# Patient Record
Sex: Female | Born: 1995 | Race: White | Hispanic: No | Marital: Single | Smoking: Never smoker
Health system: Southern US, Community
[De-identification: ages and names within clinical notes are randomized; demographics above are authoritative.]

## PROBLEM LIST (undated history)

## (undated) DIAGNOSIS — F32A Depression, unspecified: Secondary | ICD-10-CM

## (undated) DIAGNOSIS — J45909 Unspecified asthma, uncomplicated: Secondary | ICD-10-CM

## (undated) DIAGNOSIS — F329 Major depressive disorder, single episode, unspecified: Secondary | ICD-10-CM

## (undated) DIAGNOSIS — F419 Anxiety disorder, unspecified: Secondary | ICD-10-CM

## (undated) DIAGNOSIS — I82409 Acute embolism and thrombosis of unspecified deep veins of unspecified lower extremity: Secondary | ICD-10-CM

## (undated) HISTORY — DX: Depression, unspecified: F32.A

## (undated) HISTORY — DX: Anxiety disorder, unspecified: F41.9

## (undated) HISTORY — DX: Major depressive disorder, single episode, unspecified: F32.9

## (undated) HISTORY — PX: TONSILLECTOMY: SUR1361

## (undated) HISTORY — DX: Unspecified asthma, uncomplicated: J45.909

---
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
## 2016-05-06 ENCOUNTER — Ambulatory Visit: Payer: Self-pay

## 2016-05-09 ENCOUNTER — Ambulatory Visit: Payer: Self-pay | Admitting: Obstetrics and Gynecology

## 2016-05-10 ENCOUNTER — Encounter: Payer: Self-pay | Admitting: Obstetrics and Gynecology

## 2016-05-10 ENCOUNTER — Ambulatory Visit: Payer: Self-pay | Admitting: Obstetrics and Gynecology

## 2016-05-10 DIAGNOSIS — Z308 Encounter for other contraceptive management: Secondary | ICD-10-CM | POA: Insufficient documentation

## 2016-05-11 ENCOUNTER — Encounter: Payer: Self-pay | Admitting: Obstetrics and Gynecology

## 2016-05-11 ENCOUNTER — Ambulatory Visit (INDEPENDENT_AMBULATORY_CARE_PROVIDER_SITE_OTHER): Payer: Medicaid Other

## 2016-05-11 ENCOUNTER — Ambulatory Visit (INDEPENDENT_AMBULATORY_CARE_PROVIDER_SITE_OTHER): Payer: Medicaid Other | Admitting: Obstetrics and Gynecology

## 2016-05-11 VITALS — BP 110/72 | Ht 60.0 in | Wt 112.0 lb

## 2016-05-11 DIAGNOSIS — Z113 Encounter for screening for infections with a predominantly sexual mode of transmission: Secondary | ICD-10-CM | POA: Diagnosis not present

## 2016-05-11 DIAGNOSIS — Z3042 Encounter for surveillance of injectable contraceptive: Secondary | ICD-10-CM

## 2016-05-11 DIAGNOSIS — Z01419 Encounter for gynecological examination (general) (routine) without abnormal findings: Secondary | ICD-10-CM

## 2016-05-11 DIAGNOSIS — Z0000 Encounter for general adult medical examination without abnormal findings: Secondary | ICD-10-CM | POA: Diagnosis not present

## 2016-05-11 MED ORDER — MEDROXYPROGESTERONE ACETATE 150 MG/ML IM SUSY: 150.0000 mg | PREFILLED_SYRINGE | Freq: Once | INTRAMUSCULAR | 3 refills | Status: DC

## 2016-05-11 MED ORDER — MEDROXYPROGESTERONE ACETATE 150 MG/ML IM SUSP
150.0000 mg | Freq: Once | INTRAMUSCULAR | Status: AC
  Administered 2016-05-11: 150 mg via INTRAMUSCULAR

## 2016-05-11 NOTE — Progress Notes (Signed)
Chief Complaint  Patient presents with  . abnormal bleeding  . Contraception   Annual  HPI:      [name] is a 21 y.o. G0P0000 who LMP was Patient's last menstrual period was 03/13/2016., presents today for her annual examination.  Her menses are irregular with the depo. She has had 2 shots and is due for her third. She has had irreg bleeding for the past month. Dysmenorrhea none. She would like to cont the depo. She is using it for period relief.   Sex activity: has sex with females.   Hx of STDs: none  There is no FH of breast cancer. There is no FH of ovarian cancer. The patient does do self-breast exams.  Tobacco use: The patient denies current or previous tobacco use. Alcohol use: social drinker Exercise: moderately active  She does not get adequate calcium and Vitamin D in her diet.   Past Medical History:  Diagnosis Date  . Asthma     Past Surgical History:  Procedure Laterality Date  . TONSILLECTOMY      Family History  Problem Relation Age of Onset  . Cancer Mother 4    colon    Social History   Social History  . Marital status: Single    Spouse name: N/A  . Number of children: N/A  . Years of education: 48   Occupational History  . restaurant employee    Social History Main Topics  . Smoking status: Never Smoker  . Smokeless tobacco: Never Used  . Alcohol use No  . Drug use: No  . Sexual activity: Yes    Birth control/ protection: Injection   Other Topics Concern  . Not on file   Social History Narrative  . No narrative on file     Current Outpatient Prescriptions:  Marland Kitchen  MedroxyPROGESTERone Acetate 150 MG/ML SUSY, Inject 1 mL (150 mg total) into the muscle once., Disp: 1 Syringe, Rfl: 3  ROS:  Review of Systems  Constitutional: Positive for weight loss. Negative for fever and malaise/fatigue.  HENT: Negative for congestion, ear pain and sinus pain.   Respiratory: Negative for cough, shortness of breath and wheezing.     Cardiovascular: Negative for chest pain, orthopnea and leg swelling.  Gastrointestinal: Negative for constipation, diarrhea, nausea and vomiting.  Genitourinary: Negative for dysuria, frequency, hematuria and urgency.       Breast ROS: negative   Musculoskeletal: Negative for back pain, joint pain and myalgias.  Skin: Negative for itching and rash.  Neurological: Positive for headaches. Negative for dizziness, tingling and focal weakness.  Endo/Heme/Allergies: Negative for environmental allergies. Does not bruise/bleed easily.  Psychiatric/Behavioral: Negative for depression and suicidal ideas. The patient is not nervous/anxious and does not have insomnia.     Objective: BP 110/72   Ht 5' (1.524 m)   Wt 112 lb (50.8 kg)   LMP 03/13/2016   BMI 21.87 kg/m    Physical Exam  Constitutional: She is oriented to person, place, and time. She appears well-developed and well-nourished.  Genitourinary: Vagina normal and uterus normal. No erythema or tenderness in the vagina. No vaginal discharge found. Right adnexum does not display mass and does not display tenderness. Left adnexum does not display mass and does not display tenderness. Cervix does not exhibit motion tenderness or polyp. Uterus is not enlarged or tender.  Neck: Normal range of motion. No thyromegaly present.  Cardiovascular: Normal rate, regular rhythm and normal heart sounds.   No murmur heard.  Pulmonary/Chest: Effort normal and breath sounds normal. Right breast exhibits no mass, no nipple discharge, no skin change and no tenderness. Left breast exhibits no mass, no nipple discharge, no skin change and no tenderness.  Abdominal: Soft. There is no tenderness. There is no guarding.  Musculoskeletal: Normal range of motion.  Neurological: She is alert and oriented to person, place, and time. No cranial nerve deficit.  Psychiatric: She has a normal mood and affect. Her behavior is normal.  Vitals  reviewed.   Assessment/Plan: Encounter for annual routine gynecological examination  Screening for STD (sexually transmitted disease) - Plan: Chlamydia/Gonococcus/Trichomonas, NAA  Encounter for surveillance of injectable contraceptive - Depo RF. F/u if wants to change due to irreg bleeding. Reassurance. Increase calcium.  - Plan: MedroxyPROGESTERone Acetate 150 MG/ML SUSY             GYN counsel adequate intake of calcium and vitamin D     F/U  Return in about 1 year (around 05/11/2017).  Alicia B. Copland, PA-C 05/11/2016 2:49 PM

## 2016-05-13 LAB — CHLAMYDIA/GONOCOCCUS/TRICHOMONAS, NAA
Chlamydia by NAA: NEGATIVE
Gonococcus by NAA: NEGATIVE
Trich vag by NAA: NEGATIVE

## 2016-08-03 ENCOUNTER — Ambulatory Visit (INDEPENDENT_AMBULATORY_CARE_PROVIDER_SITE_OTHER): Payer: Medicaid Other

## 2016-08-03 DIAGNOSIS — Z3042 Encounter for surveillance of injectable contraceptive: Secondary | ICD-10-CM

## 2016-08-03 MED ORDER — MEDROXYPROGESTERONE ACETATE 150 MG/ML IM SUSP
150.0000 mg | Freq: Once | INTRAMUSCULAR | Status: AC
  Administered 2016-08-03: 150 mg via INTRAMUSCULAR

## 2016-08-03 MED ORDER — MEDROXYPROGESTERONE ACETATE 150 MG/ML IM SUSY: 150.0000 mg | PREFILLED_SYRINGE | Freq: Once | INTRAMUSCULAR | 2 refills | Status: DC

## 2016-08-03 NOTE — Telephone Encounter (Signed)
This encounter was created in error - please disregard.

## 2016-08-03 NOTE — Progress Notes (Addendum)
Pt with bleeding about 10 wks into depo. Can't go full 12 wks. Will try depo Q10 wks instead. Rx changed and resent. Pt to RTO 10/12/16 (depo given today). F/u prn.

## 2016-08-03 NOTE — Progress Notes (Addendum)
[id number] Pt here today for Depo Provera Injection. Pt states this is her 4th injection. She is still beginning to bleed 2.5-3 wks prior to receiving her injection every 12 wks. Pt aware [facility] & possibly adjustment of Depo schedule to control bleeding.

## 2016-08-03 NOTE — Addendum Note (Signed)
Addended by: Althea GrimmerOPLAND, Lionell Matuszak B on: 08/03/2016 05:18 PM   Modules accepted: Orders

## 2016-08-07 ENCOUNTER — Emergency Department: Payer: Medicaid Other

## 2016-08-07 ENCOUNTER — Encounter: Payer: Self-pay | Admitting: Emergency Medicine

## 2016-08-07 DIAGNOSIS — M79604 Pain in right leg: Secondary | ICD-10-CM | POA: Diagnosis present

## 2016-08-07 DIAGNOSIS — J45909 Unspecified asthma, uncomplicated: Secondary | ICD-10-CM | POA: Diagnosis not present

## 2016-08-07 NOTE — ED Triage Notes (Signed)
Pt comes into the ED via POV c/o pain behind her right knee and calf.  Patient states the pain started today and she has a h/o DVT.  Patient states this one is worse than previous ones.  Patient states they have already had to change doses on her birth control medication for this problem.  Patient denies that it is any worse with movement, redness or heat coming from the leg.  Patient states there is swelling noted behind the right knee.

## 2016-08-08 ENCOUNTER — Emergency Department
Admission: EM | Admit: 2016-08-08 | Discharge: 2016-08-08 | Disposition: A | Discharge status: Home / Self Care | Payer: Medicaid Other | Attending: Emergency Medicine | Admitting: Emergency Medicine

## 2016-08-08 DIAGNOSIS — M79604 Pain in right leg: Secondary | ICD-10-CM

## 2016-08-08 HISTORY — DX: Acute embolism and thrombosis of unspecified deep veins of unspecified lower extremity: I82.409

## 2016-08-08 MED ORDER — IBUPROFEN 800 MG PO TABS: 800.0000 mg | ORAL_TABLET | Freq: Three times a day (TID) | ORAL | 0 refills | Status: DC | PRN

## 2016-08-08 MED ORDER — IBUPROFEN 600 MG PO TABS
600.0000 mg | ORAL_TABLET | Freq: Once | ORAL | Status: AC
  Administered 2016-08-08: 600 mg via ORAL

## 2016-08-08 MED ORDER — IBUPROFEN 600 MG PO TABS
ORAL_TABLET | ORAL | Status: AC
  Filled 2016-08-08: qty 1

## 2016-08-08 NOTE — ED Notes (Signed)
Pt c/o pain behind right knee that radiates down the back of her leg; painful to ambulate; pain started Sunday; pt concerned for DVT as she has a history of the same;

## 2016-08-08 NOTE — ED Provider Notes (Signed)
[facility] Emergency Department Provider Note   First MD Initiated Contact with Patient 08/08/16 [phone]     (approximate)  I have reviewed the triage vital signs and the nursing notes.   HISTORY  Chief Complaint Claudication   HPI Ebony Edwards is a 21 y.o. female with below list of chronic medical conditions including DVT presents to the emergency department with posterior right lower extremity pain with onset yesterday. Patient states her current pain score is 7 out of 10. Patient denies any chest pain or shortness of breath. Patient states that she was diagnosed with a DVT years ago however was never placed on anticoagulation. Patient states that no ultrasound was performed at that time that she was told by the doctor that she had a clot in that she's had "multiple clots in her leg and arms which she has passed".   Past Medical History:  Diagnosis Date  . Asthma   . DVT (deep venous thrombosis) [facility])     Patient Active Problem List   Diagnosis Date Noted  . Encounter for other contraceptive management 05/10/2016    Past Surgical History:  Procedure Laterality Date  . TONSILLECTOMY      Prior to Admission medications   Medication Sig Start Date End Date Taking? Authorizing Provider  MedroxyPROGESTERone Acetate 150 MG/ML SUSY Inject 1 mL (150 mg total) into the muscle once. To be given every 10 weeks 08/03/16 08/07/17 Yes Copland, Ilona Sorrel, PA-C  ibuprofen (ADVIL,MOTRIN) 800 MG tablet Take 1 tablet (800 mg total) by mouth every 8 (eight) hours as needed. 08/08/16   Darci Current, MD    Allergies Patient has no known allergies.  Family History  Problem Relation Age of Onset  . Cancer Mother 38       colon    Social History Social History  Substance Use Topics  . Smoking status: Never Smoker  . Smokeless tobacco: Never Used  . Alcohol use No    Review of Systems Constitutional: No fever/chills Eyes: No visual changes. ENT: No  sore throat. Cardiovascular: Denies chest pain. Respiratory: Denies shortness of breath. Gastrointestinal: No abdominal pain.  No nausea, no vomiting.  No diarrhea.  No constipation. Genitourinary: Negative for dysuria. Musculoskeletal: Negative for neck pain.  Negative for back pain.Positive for right lower extremity pain Integumentary: Negative for rash. Neurological: Negative for headaches, focal weakness or numbness.   ____________________________________________   PHYSICAL EXAM:  VITAL SIGNS: ED Triage Vitals [08/07/16 2332]  Enc Vitals Group     BP 122/74     Pulse Rate 84     Resp 17     Temp 98.5 F (36.9 C)     Temp Source Oral     SpO2 100 %     Weight 51.3 kg (113 lb)     Height 1.524 m (5')     Head Circumference      Peak Flow      Pain Score 8     Pain Loc      Pain Edu?      Excl. in GC?     Constitutional: Alert and oriented. Well appearing and in no acute distress. Eyes: Conjunctivae are normal. PERRL. EOMI. Head: Atraumatic. Mouth/Throat: Mucous membranes are moist.  Oropharynx non-erythematous. Neck: No stridor.   Cardiovascular: Normal rate, regular rhythm. Good peripheral circulation. Grossly normal heart sounds. Respiratory: Normal respiratory effort.  No retractions. Lungs CTAB. Gastrointestinal: Soft and nontender. No distention.  Musculoskeletal: No lower extremity tenderness  nor edema. No gross deformities of extremities. Neurologic:  Normal speech and language. No gross focal neurologic deficits are appreciated.  Skin:  Skin is warm, dry and intact. No rash noted. Psychiatric: Mood and affect are normal. Speech and behavior are normal.    RADIOLOGY I, [place] N Boen Sterbenz, personally viewed and evaluated these images (plain radiographs) as part of my medical decision making, as well as reviewing the written report by the radiologist.  [place] Venous Img Lower Unilateral Right  Result Date: 08/08/2016 CLINICAL DATA:  21 year old female with  right lower extremity pain. EXAM: Right LOWER EXTREMITY VENOUS DOPPLER ULTRASOUND TECHNIQUE: Gray-scale sonography with graded compression, as well as color Doppler and duplex ultrasound were performed to evaluate the lower extremity deep venous systems from the level of the common femoral vein and including the common femoral, femoral, profunda femoral, popliteal and calf veins including the posterior tibial, peroneal and gastrocnemius veins when visible. The superficial great saphenous vein was also interrogated. Spectral Doppler was utilized to evaluate flow at rest and with distal augmentation maneuvers in the common femoral, femoral and popliteal veins. COMPARISON:  None. FINDINGS: Contralateral Common Femoral Vein: Respiratory phasicity is normal and symmetric with the symptomatic side. No evidence of thrombus. Normal compressibility. Common Femoral Vein: No evidence of thrombus. Normal compressibility, respiratory phasicity and response to augmentation. Saphenofemoral Junction: No evidence of thrombus. Normal compressibility and flow on color Doppler imaging. Profunda Femoral Vein: No evidence of thrombus. Normal compressibility and flow on color Doppler imaging. Femoral Vein: No evidence of thrombus. Normal compressibility, respiratory phasicity and response to augmentation. Popliteal Vein: No evidence of thrombus. Normal compressibility, respiratory phasicity and response to augmentation. Calf Veins: No evidence of thrombus. Normal compressibility and flow on color Doppler imaging. Superficial Great Saphenous Vein: No evidence of thrombus. Normal compressibility and flow on color Doppler imaging. Venous Reflux:  None. Other Findings:  None. IMPRESSION: No evidence of DVT within the right lower extremity. Electronically Signed   By: Elgie CollardArash  Radparvar M.D.   On: 08/08/2016 00:31      Procedures   ____________________________________________   INITIAL IMPRESSION / ASSESSMENT AND PLAN / ED  COURSE  Pertinent labs & imaging results that were available during my care of the patient were reviewed by me and considered in my medical decision making (see chart for details).        ____________________________________________  FINAL CLINICAL IMPRESSION(S) / ED DIAGNOSES  Final diagnoses:  Right leg pain     MEDICATIONS GIVEN DURING THIS VISIT:  Medications  ibuprofen (ADVIL,MOTRIN) tablet 600 mg (not administered)  ibuprofen (ADVIL,MOTRIN) 600 MG tablet (not administered)     NEW OUTPATIENT MEDICATIONS STARTED DURING THIS VISIT:  New Prescriptions   IBUPROFEN (ADVIL,MOTRIN) 800 MG TABLET    Take 1 tablet (800 mg total) by mouth every 8 (eight) hours as needed.    Modified Medications   No medications on file    Discontinued Medications   No medications on file     Note:  This document was prepared using Dragon voice recognition software and may include unintentional dictation errors.    Darci CurrentBrown, Coke N, MD 08/09/16 [phone]

## 2016-10-26 ENCOUNTER — Ambulatory Visit (INDEPENDENT_AMBULATORY_CARE_PROVIDER_SITE_OTHER): Payer: Medicaid Other

## 2016-10-26 DIAGNOSIS — Z3042 Encounter for surveillance of injectable contraceptive: Secondary | ICD-10-CM

## 2016-10-26 MED ORDER — MEDROXYPROGESTERONE ACETATE 150 MG/ML IM SUSP
150.0000 mg | Freq: Once | INTRAMUSCULAR | Status: AC
  Administered 2016-10-26: 150 mg via INTRAMUSCULAR

## 2017-01-02 ENCOUNTER — Ambulatory Visit: Payer: Self-pay | Admitting: Family Medicine

## 2017-01-19 ENCOUNTER — Ambulatory Visit (INDEPENDENT_AMBULATORY_CARE_PROVIDER_SITE_OTHER): Payer: Medicaid Other

## 2017-01-19 DIAGNOSIS — Z3042 Encounter for surveillance of injectable contraceptive: Secondary | ICD-10-CM | POA: Diagnosis not present

## 2017-01-19 MED ORDER — MEDROXYPROGESTERONE ACETATE 150 MG/ML IM SUSP
150.0000 mg | Freq: Once | INTRAMUSCULAR | Status: AC
  Administered 2017-01-19: 150 mg via INTRAMUSCULAR

## 2017-02-23 ENCOUNTER — Ambulatory Visit (INDEPENDENT_AMBULATORY_CARE_PROVIDER_SITE_OTHER): Payer: Medicaid Other | Admitting: Family Medicine

## 2017-02-23 ENCOUNTER — Encounter: Payer: Self-pay | Admitting: Family Medicine

## 2017-02-23 VITALS — BP 128/72 | HR 80 | Temp 98.5°F | Resp 16 | Ht 61.75 in | Wt 126.0 lb

## 2017-02-23 DIAGNOSIS — J4520 Mild intermittent asthma, uncomplicated: Secondary | ICD-10-CM | POA: Diagnosis not present

## 2017-02-23 DIAGNOSIS — F339 Major depressive disorder, recurrent, unspecified: Secondary | ICD-10-CM

## 2017-02-23 DIAGNOSIS — Z86718 Personal history of other venous thrombosis and embolism: Secondary | ICD-10-CM | POA: Diagnosis not present

## 2017-02-23 DIAGNOSIS — F411 Generalized anxiety disorder: Secondary | ICD-10-CM | POA: Diagnosis not present

## 2017-02-23 DIAGNOSIS — Z114 Encounter for screening for human immunodeficiency virus [HIV]: Secondary | ICD-10-CM | POA: Diagnosis not present

## 2017-02-23 MED ORDER — CITALOPRAM HYDROBROMIDE 20 MG PO TABS: 20.0000 mg | ORAL_TABLET | Freq: Every day | ORAL | 3 refills | Status: DC

## 2017-02-23 NOTE — Patient Instructions (Signed)

## 2017-02-23 NOTE — Progress Notes (Signed)
Patient: Ebony Edwards, Female    DOB: Dec 31, 1995, 22 y.o.   MRN: [id number] Visit Date: 02/23/2017  Today's Provider: Shirlee LatchAngela Maudry Zeidan, MD   Chief Complaint  Patient presents with  . Establish Care   Subjective:    Annual physical exam Ebony Edwards is a 22 y.o. female who presents today for health maintenance and complete physical. She feels well. She reports she is not exercising regularly. She reports she is sleeping fairly well. Has trouble falling asleep, but sleeps well after she does fall asleep.  ----------------------------------------------------------------- No PCP since leaving pediatrician. Has seen GYN for Depo shots.  Has never had a pap smear.  DVT: DVT is listed in patient's history.  She states she was told something about this by her pediatrician.  States she has never taken an anticoagulant, though.  Has been tested within last year for leg pain and no DVT visualized.  Unclear if she truly has had DVT in the past.  Asthma: Never been hospitalized, no intubation. Last flare was in 10/2016. No controller meds. Takes albuterol for rescue very rarely.  Depression/Anxiety: states she has never been diagnosed with depression or anxiety.  She has never taken any medications for these.  She has never seen a therapist or counselor.  She does admit to excessive worry, difficulty sleeping, irritability, decreased concentration, restlessness, nervousness, depressed mood, anhedonia.  Review of Systems  Constitutional: Positive for appetite change and fatigue. Negative for activity change, chills, diaphoresis, fever and unexpected weight change.  HENT: Positive for nosebleeds and tinnitus. Negative for congestion, dental problem, drooling, ear discharge, ear pain, facial swelling, hearing loss, mouth sores, postnasal drip, rhinorrhea, sinus pressure, sinus pain, sneezing, sore throat, trouble swallowing and voice change.   Eyes: Negative.   Respiratory: Positive for  shortness of breath. Negative for apnea, cough, choking, chest tightness, wheezing and stridor.   Cardiovascular: Negative.   Gastrointestinal: Negative.   Endocrine: Negative.   Genitourinary: Negative.   Musculoskeletal: Negative.   Skin: Negative.   Allergic/Immunologic: Negative.   Neurological: Negative.   Hematological: Negative.   Psychiatric/Behavioral: Positive for agitation and dysphoric mood. Negative for behavioral problems, confusion, decreased concentration, hallucinations, self-injury, sleep disturbance and suicidal ideas. The patient is nervous/anxious. The patient is not hyperactive.     Social History      She  reports that  has never smoked. she has never used smokeless tobacco. She reports that she drinks alcohol. She reports that she does not use drugs.       Social History   Socioeconomic History  . Marital status: Single    Spouse name: None  . Number of children: 0  . Years of education: 7412  . Highest education level: High school graduate  Social Needs  . Financial resource strain: None  . Food insecurity - worry: None  . Food insecurity - inability: None  . Transportation needs - medical: None  . Transportation needs - non-medical: None  Occupational History  . Occupation: Radio producerrestaurant employee    Employer: [employer]  Tobacco Use  . Smoking status: Never Smoker  . Smokeless tobacco: Never Used  Substance and Sexual Activity  . Alcohol use: Yes    Comment: drinks once per month. Will drink between 1-4 drinks at a time.  . Drug use: No  . Sexual activity: Yes    Birth control/protection: Injection  Other Topics Concern  . None  Social History Narrative  . None    Past Medical History:  Diagnosis Date  . Asthma   . DVT (deep venous thrombosis) [facility])     Patient Active Problem List   Diagnosis Date Noted  . Encounter for other contraceptive management 05/10/2016    Past Surgical History:  Procedure Laterality Date  . TONSILLECTOMY       Family History  Family Status  Relation Name Status  . Mother  Alive  . Father  Alive  . Sister Serita Kyle Alive        Her family history includes Cancer (age of onset: 54) in her mother; Diabetes in her father; Hypertension in her father and sister; Migraines in her sister; Narcolepsy in her sister.     No Known Allergies   Current Outpatient Medications:  Marland Kitchen  MedroxyPROGESTERone Acetate 150 MG/ML SUSY, Inject 1 mL (150 mg total) into the muscle once. To be given every 10 weeks, Disp: 1 Syringe, Rfl: 2   Patient Care Team: Erasmo Downer, MD as PCP - General (Family Medicine)      Objective:   Vitals: BP 128/72 (BP Location: Left Arm, Patient Position: Sitting, Cuff Size: Normal)   Pulse 80   Temp 98.5 F (36.9 C) (Oral)   Resp 16   Ht 5' 1.75" (1.568 m)   Wt 126 lb (57.2 kg)   BMI 23.23 kg/m    Vitals:   02/23/17 1411  BP: 128/72  Pulse: 80  Resp: 16  Temp: 98.5 F (36.9 C)  TempSrc: Oral  Weight: 126 lb (57.2 kg)  Height: 5' 1.75" (1.568 m)     Physical Exam  Constitutional: She is oriented to person, place, and time. She appears well-developed and well-nourished. No distress.  HENT:  Head: Normocephalic and atraumatic.  Right Ear: External ear normal.  Left Ear: External ear normal.  Nose: Nose normal.  Mouth/Throat: Oropharynx is clear and moist.  Eyes: Conjunctivae and EOM are normal. Pupils are equal, round, and reactive to light. No scleral icterus.  Neck: Neck supple. No thyromegaly present.  Cardiovascular: Normal rate, regular rhythm, normal heart sounds and intact distal pulses.  No murmur heard. Pulmonary/Chest: Effort normal and breath sounds normal. No respiratory distress. She has no wheezes. She has no rales.  Abdominal: Soft. Bowel sounds are normal. She exhibits no distension. There is no tenderness. There is no rebound and no guarding.  Musculoskeletal: She exhibits no edema or deformity.  Lymphadenopathy:    She has no cervical  adenopathy.  Neurological: She is alert and oriented to person, place, and time.  Skin: Skin is warm and dry. No rash noted.  Psychiatric: She has a normal mood and affect. Her behavior is normal.  Vitals reviewed.    Depression Screen PHQ 2/9 Scores 02/23/2017  PHQ - 2 Score 3  PHQ- 9 Score 14   GAD 7 : Generalized Anxiety Score 02/23/2017  Nervous, Anxious, on Edge 3  Control/stop worrying 1  Worry too much - different things 2  Trouble relaxing 1  Restless 1  Easily annoyed or irritable 2  Afraid - awful might happen 1  Total GAD 7 Score 11  Anxiety Difficulty Somewhat difficult     Assessment & Plan:    Problem List Items Addressed This Visit      Respiratory   Mild intermittent asthma    Well-controlled Continue as needed albuterol use Discussed what to do in case of an exacerbation      Relevant Medications   albuterol (PROVENTIL HFA;VENTOLIN HFA) 108 (90 Base) MCG/ACT inhaler  Other   Depression, recurrent (HCC) - Primary    New diagnosis, but long-standing problem moderate PHQ 9 score Discussed importance of therapy or counseling, but patient declines at this time We will start SSRI with Celexa 20 mg daily Discussed that it can take 6-8 weeks to reach full efficacy Discussed potential side effects including GI upset, sexual dysfunction Discussed black box warning regarding increased suicidality Follow-up in 1 month, repeat PHQ9, consider dose titration      Relevant Medications   citalopram (CELEXA) 20 MG tablet   Other Relevant Orders   CBC w/Diff/Platelet (Completed)   Comprehensive metabolic panel (Completed)   TSH (Completed)   GAD (generalized anxiety disorder)    New diagnosis, but long-standing problem Uncontrolled We will start SSRI as above for depression and anxiety Discussed potential side effects and time course to reach full efficacy as above Follow-up in 1 month, repeat GAD 7 To consider dose titration of Celexa as indicated        Relevant Medications   citalopram (CELEXA) 20 MG tablet   Other Relevant Orders   CBC w/Diff/Platelet (Completed)   Comprehensive metabolic panel (Completed)   TSH (Completed)   History of DVT (deep vein thrombosis)    Unclear if she truly has VTE history and she has never been treated with anticoagulant Will request previous PCP records Will determine future risk       Other Visit Diagnoses    Screening for HIV (human immunodeficiency virus)       Relevant Orders   HIV antibody (with reflex) (Completed)       Return in about 4 weeks (around 03/23/2017) for depression f/u.   The entirety of the information documented in the History of Present Illness, Review of Systems and Physical Exam were personally obtained by me. Portions of this information were initially documented by Irving Burton Ratchford, CMA and reviewed by me for thoroughness and accuracy.    Erasmo Downer, MD, MPH [facility] 02/24/2017 10:11 AM

## 2017-02-24 DIAGNOSIS — Z86718 Personal history of other venous thrombosis and embolism: Secondary | ICD-10-CM | POA: Insufficient documentation

## 2017-02-24 LAB — CBC WITH DIFFERENTIAL/PLATELET
BASOS ABS: 0 10*3/uL (ref 0.0–0.2)
BASOS: 0 %
EOS (ABSOLUTE): 0.1 10*3/uL (ref 0.0–0.4)
EOS: 1 %
HEMATOCRIT: 42.3 % (ref 34.0–46.6)
HEMOGLOBIN: 14.4 g/dL (ref 11.1–15.9)
IMMATURE GRANS (ABS): 0 10*3/uL (ref 0.0–0.1)
Immature Granulocytes: 0 %
LYMPHS ABS: 2.2 10*3/uL (ref 0.7–3.1)
LYMPHS: 27 %
MCH: 28.9 pg (ref 26.6–33.0)
MCHC: 34 g/dL (ref 31.5–35.7)
MCV: 85 fL (ref 79–97)
MONOCYTES: 4 %
Monocytes Absolute: 0.4 10*3/uL (ref 0.1–0.9)
NEUTROS ABS: 5.5 10*3/uL (ref 1.4–7.0)
Neutrophils: 68 %
Platelets: 300 10*3/uL (ref 150–379)
RBC: 4.98 x10E6/uL (ref 3.77–5.28)
RDW: 13.8 % (ref 12.3–15.4)
WBC: 8.2 10*3/uL (ref 3.4–10.8)

## 2017-02-24 LAB — COMPREHENSIVE METABOLIC PANEL
ALBUMIN: 4.8 g/dL (ref 3.5–5.5)
ALK PHOS: 47 IU/L (ref 39–117)
ALT: 15 IU/L (ref 0–32)
AST: 18 IU/L (ref 0–40)
Albumin/Globulin Ratio: 1.9 (ref 1.2–2.2)
BILIRUBIN TOTAL: 0.3 mg/dL (ref 0.0–1.2)
BUN / CREAT RATIO: 22 (ref 9–23)
BUN: 14 mg/dL (ref 6–20)
CHLORIDE: 104 mmol/L (ref 96–106)
CO2: 20 mmol/L (ref 20–29)
Calcium: 9.5 mg/dL (ref 8.7–10.2)
Creatinine, Ser: 0.63 mg/dL (ref 0.57–1.00)
GFR calc non Af Amer: 129 mL/min/{1.73_m2} (ref >59)
GFR, EST AFRICAN AMERICAN: 148 mL/min/{1.73_m2} (ref >59)
GLOBULIN, TOTAL: 2.5 g/dL (ref 1.5–4.5)
GLUCOSE: 87 mg/dL (ref 65–99)
Potassium: 4.1 mmol/L (ref 3.5–5.2)
SODIUM: 142 mmol/L (ref 134–144)
Total Protein: 7.3 g/dL (ref 6.0–8.5)

## 2017-02-24 LAB — HIV ANTIBODY (ROUTINE TESTING W REFLEX): HIV Screen 4th Generation wRfx: NONREACTIVE

## 2017-02-24 LAB — TSH: TSH: 1.17 u[IU]/mL (ref 0.450–4.500)

## 2017-02-24 NOTE — Assessment & Plan Note (Signed)
New diagnosis, but long-standing problem Uncontrolled We will start SSRI as above for depression and anxiety Discussed potential side effects and time course to reach full efficacy as above Follow-up in 1 month, repeat GAD 7 To consider dose titration of Celexa as indicated

## 2017-02-24 NOTE — Assessment & Plan Note (Addendum)
Unclear if she truly has VTE history and she has never been treated with anticoagulant Will request previous PCP records Will determine future risk

## 2017-02-24 NOTE — Assessment & Plan Note (Signed)
Well-controlled Continue as needed albuterol use Discussed what to do in case of an exacerbation

## 2017-02-24 NOTE — Assessment & Plan Note (Addendum)
New diagnosis, but long-standing problem moderate PHQ 9 score Discussed importance of therapy or counseling, but patient declines at this time We will start SSRI with Celexa 20 mg daily Discussed that it can take 6-8 weeks to reach full efficacy Discussed potential side effects including GI upset, sexual dysfunction Discussed black box warning regarding increased suicidality Follow-up in 1 month, repeat PHQ9, consider dose titration

## 2017-03-27 ENCOUNTER — Ambulatory Visit: Payer: Self-pay | Admitting: Family Medicine

## 2017-03-27 NOTE — Progress Notes (Deleted)
       Patient: Ebony Edwards Female    DOB: [birth year]   22 y.o.   MRN: [id number] Visit Date: 03/27/2017  Today's Provider: Shirlee LatchAngela Bacigalupo, MD   I, Joslyn HyEmily Ratchford, CMA, am acting as scribe for Shirlee LatchAngela Bacigalupo, MD.  No chief complaint on file.  Subjective:    Depression         Chronicity: FU from 02/23/2017.      No Known Allergies   Current Outpatient Medications:  .  albuterol (PROVENTIL HFA;VENTOLIN HFA) 108 (90 Base) MCG/ACT inhaler, Inhale 1-2 puffs into the lungs every 6 (six) hours as needed., Disp: , Rfl:  .  citalopram (CELEXA) 20 MG tablet, Take 1 tablet (20 mg total) by mouth daily., Disp: 30 tablet, Rfl: 3 .  MedroxyPROGESTERone Acetate 150 MG/ML SUSY, Inject 1 mL (150 mg total) into the muscle once. To be given every 10 weeks, Disp: 1 Syringe, Rfl: 2  Review of Systems  Psychiatric/Behavioral: Positive for depression.    Social History   Tobacco Use  . Smoking status: Never Smoker  . Smokeless tobacco: Never Used  Substance Use Topics  . Alcohol use: Yes    Comment: drinks once per month. Will drink between 1-4 drinks at a time.   Objective:   There were no vitals taken for this visit. There were no vitals filed for this visit.   Physical Exam      Assessment & Plan:

## 2017-04-13 ENCOUNTER — Ambulatory Visit (INDEPENDENT_AMBULATORY_CARE_PROVIDER_SITE_OTHER): Payer: Medicaid Other

## 2017-04-13 DIAGNOSIS — Z3042 Encounter for surveillance of injectable contraceptive: Secondary | ICD-10-CM

## 2017-04-13 DIAGNOSIS — Z308 Encounter for other contraceptive management: Secondary | ICD-10-CM

## 2017-04-13 MED ORDER — MEDROXYPROGESTERONE ACETATE 150 MG/ML IM SUSP
150.0000 mg | Freq: Once | INTRAMUSCULAR | Status: AC
  Administered 2017-04-13: 150 mg via INTRAMUSCULAR

## 2017-04-26 ENCOUNTER — Encounter: Payer: Self-pay | Admitting: Obstetrics and Gynecology

## 2017-04-26 ENCOUNTER — Ambulatory Visit (INDEPENDENT_AMBULATORY_CARE_PROVIDER_SITE_OTHER): Payer: Medicaid Other | Admitting: Obstetrics and Gynecology

## 2017-04-26 VITALS — BP 120/70 | HR 90 | Ht 60.0 in | Wt 131.0 lb

## 2017-04-26 DIAGNOSIS — Z3042 Encounter for surveillance of injectable contraceptive: Secondary | ICD-10-CM

## 2017-04-26 DIAGNOSIS — Z113 Encounter for screening for infections with a predominantly sexual mode of transmission: Secondary | ICD-10-CM | POA: Diagnosis not present

## 2017-04-26 DIAGNOSIS — Z124 Encounter for screening for malignant neoplasm of cervix: Secondary | ICD-10-CM

## 2017-04-26 DIAGNOSIS — Z01419 Encounter for gynecological examination (general) (routine) without abnormal findings: Secondary | ICD-10-CM

## 2017-04-26 MED ORDER — MEDROXYPROGESTERONE ACETATE 150 MG/ML IM SUSY: 150.0000 mg | PREFILLED_SYRINGE | Freq: Once | INTRAMUSCULAR | 4 refills | Status: DC

## 2017-04-26 NOTE — Progress Notes (Signed)
HPI:      [name] is a 22 y.o. G0P0000 who LMP was No LMP recorded (lmp unknown). Patient has had an injection., presents today for her annual examination. Her menses are absent with depo. On depo for cycle control. Dysmenorrhea mild, occas, no meds needed. No BTB.   Sex activity: Not currently; was with female partners in past. Hx of STDs: none Never had pap due to age.   There is no FH of breast cancer. There is no FH of ovarian cancer. The patient does not do self-breast exams.  Tobacco use: The patient denies current or previous tobacco use. Alcohol use: social drinker Exercise: moderately active  She does not get adequate calcium and Vitamin D in her diet.  She thinks she had [place] series.   Past Medical History:  Diagnosis Date  . Anxiety   . Asthma   . Depression   . DVT (deep venous thrombosis) (HCC)     Past Surgical History:  Procedure Laterality Date  . TONSILLECTOMY      Family History  Problem Relation Age of Onset  . Colon cancer Mother 23  . Diabetes Father   . Hypertension Father   . Hypertension Sister   . Migraines Sister   . Narcolepsy Sister   . Breast cancer Neg Hx     Social History   Socioeconomic History  . Marital status: Single    Spouse name: Not on file  . Number of children: 0  . Years of education: 60  . Highest education level: High school graduate  Occupational History  . Occupation: Radio producer: [employer]    Comment: [employer]  Social Needs  . Financial resource strain: Not on file  . Food insecurity:    Worry: Never true    Inability: Never true  . Transportation needs:    Medical: No    Non-medical: No  Tobacco Use  . Smoking status: Never Smoker  . Smokeless tobacco: Never Used  Substance and Sexual Activity  . Alcohol use: Yes    Comment: drinks once per month. Will drink between 1-4 drinks at a time.  . Drug use: No  . Sexual activity: Not Currently    Partners: Male    Birth  control/protection: Injection  Lifestyle  . Physical activity:    Days per week: Not on file    Minutes per session: Not on file  . Stress: Not on file  Relationships  . Social connections:    Talks on phone: Not on file    Gets together: Not on file    Attends religious service: Not on file    Active member of club or organization: Not on file    Attends meetings of clubs or organizations: Not on file    Relationship status: Not on file  . Intimate partner violence:    Fear of current or ex partner: Not on file    Emotionally abused: Not on file    Physically abused: Not on file    Forced sexual activity: Not on file  Other Topics Concern  . Not on file  Social History Narrative  . Not on file    Current Outpatient Medications on File Prior to Visit  Medication Sig Dispense Refill  . albuterol (PROVENTIL HFA;VENTOLIN HFA) 108 (90 Base) MCG/ACT inhaler Inhale 1-2 puffs into the lungs every 6 (six) hours as needed.    . citalopram (CELEXA) 20 MG tablet Take 1 tablet (  20 mg total) by mouth daily. 30 tablet 3   No current facility-administered medications on file prior to visit.     ROS:  Review of Systems  Constitutional: Positive for fatigue. Negative for fever and unexpected weight change.  Respiratory: Negative for cough, shortness of breath and wheezing.   Cardiovascular: Negative for chest pain, palpitations and leg swelling.  Gastrointestinal: Negative for blood in stool, constipation, diarrhea, nausea and vomiting.  Endocrine: Negative for cold intolerance, heat intolerance and polyuria.  Genitourinary: Negative for dyspareunia, dysuria, flank pain, frequency, genital sores, hematuria, menstrual problem, pelvic pain, urgency, vaginal bleeding, vaginal discharge and vaginal pain.  Musculoskeletal: Negative for back pain, joint swelling and myalgias.  Skin: Negative for rash.  Neurological: Negative for dizziness, syncope, light-headedness, numbness and headaches.    Hematological: Negative for adenopathy.  Psychiatric/Behavioral: Positive for dysphoric mood. Negative for agitation, confusion, sleep disturbance and suicidal ideas. The patient is not nervous/anxious.      Objective: BP 120/70   Pulse 90   Ht 5' (1.524 m)   Wt 131 lb (59.4 kg)   LMP  (LMP Unknown) Comment: DEPO  BMI 25.58 kg/m    Physical Exam  Constitutional: She is oriented to person, place, and time. She appears well-developed and well-nourished.  Genitourinary: Vagina normal and uterus normal. There is no rash or tenderness on the right labia. There is no rash or tenderness on the left labia. No erythema or tenderness in the vagina. No vaginal discharge found. Right adnexum does not display mass and does not display tenderness. Left adnexum does not display mass and does not display tenderness. Cervix does not exhibit motion tenderness or polyp. Uterus is not enlarged or tender.  Neck: Normal range of motion. No thyromegaly present.  Cardiovascular: Normal rate, regular rhythm and normal heart sounds.  No murmur heard. Pulmonary/Chest: Effort normal and breath sounds normal. Right breast exhibits no mass, no nipple discharge, no skin change and no tenderness. Left breast exhibits no mass, no nipple discharge, no skin change and no tenderness.  Abdominal: Soft. There is no tenderness. There is no guarding.  Musculoskeletal: Normal range of motion.  Neurological: She is alert and oriented to person, place, and time. No cranial nerve deficit.  Psychiatric: She has a normal mood and affect. Her behavior is normal.  Vitals reviewed.  Assessment/Plan: Encounter for annual routine gynecological examination  Cervical cancer screening - Plan: IGP,CtNgTv,rfx Aptima HPV ASCU  Screening for STD (sexually transmitted disease) - Plan: IGP,CtNgTv,rfx Aptima HPV ASCU  Encounter for surveillance of injectable contraceptive - Depo RF. Increase calcium with daily supp. - Plan:  medroxyPROGESTERone Acetate 150 MG/ML SUSY  Meds ordered this encounter  Medications  . medroxyPROGESTERone Acetate 150 MG/ML SUSY    Sig: Inject 1 mL (150 mg total) into the muscle once for 1 dose. To be given every 10 weeks    Dispense:  1 Syringe    Refill:  4    Order Specific Question:   Supervising Provider    Answer:   Nadara MustardHARRIS, ROBERT P [[id number]]             GYN counsel adequate intake of calcium and vitamin D, diet and exercise     F/U  Return in about 1 year (around 04/27/2018).  Alicia B. Copland, PA-C 04/26/2017 9:07 AM

## 2017-04-26 NOTE — Patient Instructions (Signed)
I value your feedback and entrusting us with your care. If you get a [facility] patient survey, I would appreciate you taking the time to let us know about your experience today. Thank you! 

## 2017-04-29 LAB — IGP,CTNGTV,RFX APTIMA HPV ASCU
Chlamydia, Nuc. Acid Amp: NEGATIVE
Gonococcus, Nuc. Acid Amp: NEGATIVE
PAP Smear Comment: 0
TRICH VAG BY NAA: NEGATIVE

## 2017-05-18 ENCOUNTER — Other Ambulatory Visit: Payer: Self-pay | Admitting: Family Medicine

## 2017-05-18 MED ORDER — CITALOPRAM HYDROBROMIDE 20 MG PO TABS
20.0000 mg | ORAL_TABLET | Freq: Every day | ORAL | 1 refills | Status: DC
Start: 2017-05-18 — End: 2018-06-28

## 2017-05-18 NOTE — Telephone Encounter (Signed)
Pt was started on Celexa at [facility] on 02/23/2017. She no showed for the follow up.

## 2017-05-18 NOTE — Telephone Encounter (Signed)
[facility] faxed a refill request for a 90-days supply for the following medication. Thanks CC ° °citalopram (CELEXA) 20 MG tablet  ° °

## 2017-06-09 ENCOUNTER — Emergency Department
Admission: EM | Admit: 2017-06-09 | Discharge: 2017-06-09 | Disposition: A | Discharge status: Home / Self Care | Payer: Self-pay | Attending: Emergency Medicine | Admitting: Emergency Medicine

## 2017-06-09 ENCOUNTER — Emergency Department: Payer: Self-pay

## 2017-06-09 DIAGNOSIS — W230XXA Caught, crushed, jammed, or pinched between moving objects, initial encounter: Secondary | ICD-10-CM | POA: Insufficient documentation

## 2017-06-09 DIAGNOSIS — Y92511 Restaurant or cafe as the place of occurrence of the external cause: Secondary | ICD-10-CM | POA: Insufficient documentation

## 2017-06-09 DIAGNOSIS — J4520 Mild intermittent asthma, uncomplicated: Secondary | ICD-10-CM | POA: Insufficient documentation

## 2017-06-09 DIAGNOSIS — Y990 Civilian activity done for income or pay: Secondary | ICD-10-CM | POA: Insufficient documentation

## 2017-06-09 DIAGNOSIS — S9031XA Contusion of right foot, initial encounter: Secondary | ICD-10-CM | POA: Insufficient documentation

## 2017-06-09 DIAGNOSIS — Y9389 Activity, other specified: Secondary | ICD-10-CM | POA: Insufficient documentation

## 2017-06-09 DIAGNOSIS — Z79899 Other long term (current) drug therapy: Secondary | ICD-10-CM | POA: Insufficient documentation

## 2017-06-09 NOTE — ED Provider Notes (Signed)
[facility] Emergency Department Provider Note  ____________________________________________   First MD Initiated Contact with Patient 06/09/17 [phone]     (approximate)  I have reviewed the triage vital signs and the nursing notes.   HISTORY  Chief Complaint Foot Injury    HPI Ebony Edwards is a 22 y.o. female with noncontributory medical history presents for evaluation of acute onset pain and her right foot after dropping a keg on it at work.  No one else was available to cover her so she was able to work the rest of her shift by bearing most of the weight on the heel of her foot.  She ambulates with a limp.  Walking on it and bearing weight makes the pain worse, nothing in particular makes it better.  She has some swelling and bruising at the base of her big toe.  She has no numbness nor tingling.  No other injuries occurred.  Past Medical History:  Diagnosis Date  . Anxiety   . Asthma   . Depression   . DVT (deep venous thrombosis) [facility])     Patient Active Problem List   Diagnosis Date Noted  . History of DVT (deep vein thrombosis) 02/24/2017  . Mild intermittent asthma 02/23/2017  . Depression, recurrent (HCC) 02/23/2017  . GAD (generalized anxiety disorder) 02/23/2017  . Encounter for other contraceptive management 05/10/2016    Past Surgical History:  Procedure Laterality Date  . TONSILLECTOMY      Prior to Admission medications   Medication Sig Start Date End Date Taking? Authorizing Provider  albuterol (PROVENTIL HFA;VENTOLIN HFA) 108 (90 Base) MCG/ACT inhaler Inhale 1-2 puffs into the lungs every 6 (six) hours as needed.    [provider]  citalopram (CELEXA) 20 MG tablet Take 1 tablet (20 mg total) by mouth daily. 05/18/17   Erasmo Downer, MD  medroxyPROGESTERone Acetate 150 MG/ML SUSY Inject 1 mL (150 mg total) into the muscle once for 1 dose. To be given every 10 weeks 04/26/17 04/26/17  Copland, Ilona Sorrel, PA-C     Allergies Patient has no known allergies.  Family History  Problem Relation Age of Onset  . Colon cancer Mother 27  . Diabetes Father   . Hypertension Father   . Hypertension Sister   . Migraines Sister   . Narcolepsy Sister   . Breast cancer Neg Hx     Social History Social History   Tobacco Use  . Smoking status: Never Smoker  . Smokeless tobacco: Never Used  Substance Use Topics  . Alcohol use: Yes    Comment: drinks once per month. Will drink between 1-4 drinks at a time.  . Drug use: No    Review of Systems Constitutional: No fever/chills Cardiovascular: Denies chest pain. Respiratory: Denies shortness of breath. Gastrointestinal: No abdominal pain.   Musculoskeletal: Pain in right foot as described above. Integumentary: Negative for abrasions/lacerations.  +Bruising. Neurological: Negative for headaches, focal weakness or numbness.   ____________________________________________   PHYSICAL EXAM:  VITAL SIGNS: ED Triage Vitals  Enc Vitals Group     BP 06/09/17 0143 121/71     Pulse Rate 06/09/17 0143 94     Resp 06/09/17 0143 16     Temp 06/09/17 0143 98.6 F (37 C)     Temp Source 06/09/17 0143 Oral     SpO2 06/09/17 0143 100 %     Weight 06/09/17 0143 65.8 kg (145 lb)     Height 06/09/17 0143 1.524 m (5')  Head Circumference --      Peak Flow --      Pain Score 06/09/17 0145 8     Pain Loc --      Pain Edu? --      Excl. in [place]? --     Constitutional: Alert and oriented. Well appearing and in no acute distress. Head: Atraumatic. Cardiovascular: Normal rate, regular rhythm. Good peripheral circulation.  Respiratory: Normal respiratory effort.  No retractions.  Musculoskeletal: Ecchymosis and some mild swelling to the top of her right foot most notable at the base of the big toe.  Neurovascularly intact.  Tender to palpation and with plantar flexion.  No midfoot tenderness to palpation.  No ankle injury. Neurologic:  Normal speech and  language. No gross focal neurologic deficits are appreciated.  Skin:  Skin is warm, dry and intact. No rash noted. Psychiatric: Mood and affect are normal. Speech and behavior are normal.  ____________________________________________   LABS (all labs ordered are listed, but only abnormal results are displayed)  Labs Reviewed - No data to display ____________________________________________  EKG  No indication for EKG ____________________________________________  RADIOLOGY I, Loleta Rose, personally viewed and evaluated these images (plain radiographs) as part of my medical decision making, as well as reviewing the written report by the radiologist.  ED MD interpretation: No acute fracture or dislocation  Official radiology report(s): Dg Foot Complete Right  Result Date: 06/09/2017 CLINICAL DATA:  Bruising and swelling to the right first and second digit EXAM: RIGHT FOOT COMPLETE - 3+ VIEW COMPARISON:  None. FINDINGS: No acute displaced fracture or malalignment. Ossicle or old fracture injury at the volar base of the first distal phalanx. IMPRESSION: No acute osseous abnormality Electronically Signed   By: Jasmine Pang M.D.   On: 06/09/2017 02:24    ____________________________________________   PROCEDURES  Critical Care performed: No   Procedure(s) performed:   Procedures   ____________________________________________   INITIAL IMPRESSION / ASSESSMENT AND PLAN / ED COURSE  As part of my medical decision making, I reviewed the following data within the electronic MEDICAL RECORD NUMBER Nursing notes reviewed and incorporated and Radiograph reviewed     No acute fracture or dislocation.  I counseled her about foot contusion management including cryotherapy and RICE, weightbearing as tolerated, etc.  She says she does not need a note for work tomorrow.  She is able to bear weight and I offered crutches but she does not think that she needs them.  I gave my usual and  customary return precautions.      ____________________________________________  FINAL CLINICAL IMPRESSION(S) / ED DIAGNOSES  Final diagnoses:  Contusion of right foot, initial encounter     MEDICATIONS GIVEN DURING THIS VISIT:  Medications - No data to display   ED Discharge Orders    None       Note:  This document was prepared using Dragon voice recognition software and may include unintentional dictation errors.    Loleta Rose, MD 06/09/17 [phone]

## 2017-06-09 NOTE — ED Triage Notes (Signed)
Patient dropped a Keg on her right foot at work approximately 2130 this evening.

## 2017-06-09 NOTE — ED Notes (Signed)
Pt employeed with [employer] [place], [place]--no workers comp profile found and pt has no specific instructions; completed drug screen ineligibility form and copy given to pt

## 2017-06-09 NOTE — Discharge Instructions (Signed)
Fortunately there is no evidence of a fracture (break in the bone) on your x-rays.  Please read through the included information about contusions to the foot, cryotherapy (using ice packs), and RICE (Rest, Ice, Compression, and Elevation).  This type of injury may take more than a week to heal, so please follow-up with your regular doctor or with a podiatrist as needed next week or the following week if you are continuing to have symptoms.  Use over-the-counter ibuprofen and Tylenol as needed according to label instructions.  You may bear weight as tolerated.  Return to the emergency department if you develop new or worsening symptoms that concern you.

## 2017-07-06 ENCOUNTER — Ambulatory Visit: Payer: Medicaid Other

## 2017-07-12 ENCOUNTER — Ambulatory Visit (INDEPENDENT_AMBULATORY_CARE_PROVIDER_SITE_OTHER): Payer: Medicaid Other

## 2017-07-12 DIAGNOSIS — Z3049 Encounter for surveillance of other contraceptives: Secondary | ICD-10-CM

## 2017-07-12 DIAGNOSIS — Z3042 Encounter for surveillance of injectable contraceptive: Secondary | ICD-10-CM

## 2017-07-12 MED ORDER — MEDROXYPROGESTERONE ACETATE 150 MG/ML IM SUSP
150.0000 mg | Freq: Once | INTRAMUSCULAR | Status: AC
  Administered 2017-07-12: 150 mg via INTRAMUSCULAR

## 2017-08-18 ENCOUNTER — Other Ambulatory Visit: Payer: Self-pay

## 2017-08-18 ENCOUNTER — Emergency Department: Payer: Medicaid Other

## 2017-08-18 ENCOUNTER — Emergency Department
Admission: EM | Admit: 2017-08-18 | Discharge: 2017-08-18 | Disposition: A | Discharge status: Home / Self Care | Payer: Medicaid Other | Attending: Emergency Medicine | Admitting: Emergency Medicine

## 2017-08-18 ENCOUNTER — Encounter: Payer: Self-pay | Admitting: Emergency Medicine

## 2017-08-18 DIAGNOSIS — T781XXA Other adverse food reactions, not elsewhere classified, initial encounter: Secondary | ICD-10-CM | POA: Insufficient documentation

## 2017-08-18 DIAGNOSIS — J45909 Unspecified asthma, uncomplicated: Secondary | ICD-10-CM | POA: Insufficient documentation

## 2017-08-18 DIAGNOSIS — Z79899 Other long term (current) drug therapy: Secondary | ICD-10-CM | POA: Insufficient documentation

## 2017-08-18 DIAGNOSIS — T7840XA Allergy, unspecified, initial encounter: Secondary | ICD-10-CM

## 2017-08-18 DIAGNOSIS — R0602 Shortness of breath: Secondary | ICD-10-CM | POA: Insufficient documentation

## 2017-08-18 LAB — URINE DRUG SCREEN, QUALITATIVE (ARMC ONLY)
AMPHETAMINES, UR SCREEN: NOT DETECTED
BARBITURATES, UR SCREEN: NOT DETECTED
BENZODIAZEPINE, UR SCRN: NOT DETECTED
CANNABINOID 50 NG, UR ~~LOC~~: NOT DETECTED
COCAINE METABOLITE, UR ~~LOC~~: NOT DETECTED
MDMA (ECSTASY) UR SCREEN: NOT DETECTED
METHADONE SCREEN, URINE: NOT DETECTED
Opiate, Ur Screen: NOT DETECTED
Phencyclidine (PCP) Ur S: NOT DETECTED
TRICYCLIC, UR SCREEN: NOT DETECTED

## 2017-08-18 LAB — COMPREHENSIVE METABOLIC PANEL
ALBUMIN: 4.1 g/dL (ref 3.5–5.0)
ALT: 26 U/L (ref 0–44)
ANION GAP: 10 (ref 5–15)
AST: 24 U/L (ref 15–41)
Alkaline Phosphatase: 51 U/L (ref 38–126)
BUN: 14 mg/dL (ref 6–20)
CHLORIDE: 112 mmol/L — AB (ref 98–111)
CO2: 19 mmol/L — ABNORMAL LOW (ref 22–32)
Calcium: 8.7 mg/dL — ABNORMAL LOW (ref 8.9–10.3)
Creatinine, Ser: 0.57 mg/dL (ref 0.44–1.00)
GFR calc Af Amer: >60 mL/min (ref >60)
GFR calc non Af Amer: >60 mL/min (ref >60)
Glucose, Bld: 91 mg/dL (ref 70–99)
POTASSIUM: 3.4 mmol/L — AB (ref 3.5–5.1)
Sodium: 141 mmol/L (ref 135–145)
Total Bilirubin: 0.3 mg/dL (ref 0.3–1.2)
Total Protein: 7.3 g/dL (ref 6.5–8.1)

## 2017-08-18 LAB — CBC WITH DIFFERENTIAL/PLATELET
Basophils Absolute: 0 10*3/uL (ref 0–0.1)
Basophils Relative: 0 %
Eosinophils Absolute: 0 10*3/uL (ref 0–0.7)
Eosinophils Relative: 1 %
HCT: 39.6 % (ref 35.0–47.0)
Hemoglobin: 13.9 g/dL (ref 12.0–16.0)
Lymphocytes Relative: 26 %
Lymphs Abs: 1.7 10*3/uL (ref 1.0–3.6)
MCH: 29.3 pg (ref 26.0–34.0)
MCHC: 35.1 g/dL (ref 32.0–36.0)
MCV: 83.6 fL (ref 80.0–100.0)
Monocytes Absolute: 0.5 10*3/uL (ref 0.2–0.9)
Monocytes Relative: 8 %
Neutro Abs: 4.3 10*3/uL (ref 1.4–6.5)
Neutrophils Relative %: 65 %
Platelets: 274 10*3/uL (ref 150–440)
RBC: 4.74 MIL/uL (ref 3.80–5.20)
RDW: 14.1 % (ref 11.5–14.5)
WBC: 6.6 10*3/uL (ref 3.6–11.0)

## 2017-08-18 LAB — FIBRIN DERIVATIVES D-DIMER (ARMC ONLY): Fibrin derivatives D-dimer (ARMC): 231.25 ng/mL (FEU) (ref 0.00–499.00)

## 2017-08-18 LAB — PREGNANCY, URINE: Preg Test, Ur: NEGATIVE

## 2017-08-18 MED ORDER — METHYLPREDNISOLONE SODIUM SUCC 125 MG IJ SOLR
125.0000 mg | Freq: Once | INTRAMUSCULAR | Status: AC
  Administered 2017-08-18: 125 mg via INTRAVENOUS
  Filled 2017-08-18: qty 2

## 2017-08-18 MED ORDER — SODIUM CHLORIDE 0.9 % IV BOLUS
1000.0000 mL | Freq: Once | INTRAVENOUS | Status: AC
  Administered 2017-08-18: 1000 mL via INTRAVENOUS

## 2017-08-18 MED ORDER — IPRATROPIUM-ALBUTEROL 0.5-2.5 (3) MG/3ML IN SOLN
3.0000 mL | Freq: Once | RESPIRATORY_TRACT | Status: AC
  Administered 2017-08-18: 3 mL via RESPIRATORY_TRACT
  Filled 2017-08-18: qty 3

## 2017-08-18 NOTE — ED Notes (Signed)
Patient denies pain and is resting comfortably.  

## 2017-08-18 NOTE — Discharge Instructions (Signed)
Follow-up with your regular doctor if not better in 3 to 5 days.  Return emergency department if worsening.  Do not eat mango as you may have a reaction to this.  You can ask your regular doctor for an allergy test for mango or you can see an allergist.
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
## 2017-08-18 NOTE — ED Notes (Signed)
Patient discharged to home per MD order. Patient in stable condition, and deemed medically cleared by ED provider for discharge. Discharge instructions reviewed with patient/family using "Teach Back"; verbalized understanding of medication education and administration, and information about follow-up care. Denies further concerns. ° °

## 2017-08-18 NOTE — ED Triage Notes (Signed)
Pt c/o shortness of breath, states sxs started about an hour ago after getting to work.  Pt has hx of asthma and anxiety. Pt is able to speak in complete sentences without running out of breath.   No audible wheezing.  Pt states she did not take anxiety medications and did not use inhaler.

## 2017-10-04 ENCOUNTER — Ambulatory Visit (INDEPENDENT_AMBULATORY_CARE_PROVIDER_SITE_OTHER): Payer: Medicaid Other

## 2017-10-04 DIAGNOSIS — Z3049 Encounter for surveillance of other contraceptives: Secondary | ICD-10-CM

## 2017-10-04 DIAGNOSIS — Z3042 Encounter for surveillance of injectable contraceptive: Secondary | ICD-10-CM

## 2017-10-04 MED ORDER — MEDROXYPROGESTERONE ACETATE 150 MG/ML IM SUSP
150.0000 mg | Freq: Once | INTRAMUSCULAR | Status: AC
  Administered 2017-10-04: 150 mg via INTRAMUSCULAR

## 2017-12-27 ENCOUNTER — Ambulatory Visit: Payer: Self-pay

## 2017-12-28 ENCOUNTER — Ambulatory Visit (INDEPENDENT_AMBULATORY_CARE_PROVIDER_SITE_OTHER): Payer: Medicaid Other

## 2017-12-28 DIAGNOSIS — Z3042 Encounter for surveillance of injectable contraceptive: Secondary | ICD-10-CM | POA: Diagnosis not present

## 2017-12-28 MED ORDER — MEDROXYPROGESTERONE ACETATE 150 MG/ML IM SUSP
150.0000 mg | Freq: Once | INTRAMUSCULAR | Status: AC
  Administered 2017-12-28: 150 mg via INTRAMUSCULAR

## 2018-03-22 ENCOUNTER — Ambulatory Visit (INDEPENDENT_AMBULATORY_CARE_PROVIDER_SITE_OTHER): Payer: Medicaid Other

## 2018-03-22 DIAGNOSIS — Z3042 Encounter for surveillance of injectable contraceptive: Secondary | ICD-10-CM | POA: Diagnosis not present

## 2018-03-22 MED ORDER — MEDROXYPROGESTERONE ACETATE 150 MG/ML IM SUSP
150.0000 mg | Freq: Once | INTRAMUSCULAR | Status: AC
  Administered 2018-03-22: 150 mg via INTRAMUSCULAR

## 2018-03-22 NOTE — Progress Notes (Signed)
Patient presents for Depo Provera injection today within dates. Given IM LUOQ. Patient tolerated well. 

## 2018-04-30 ENCOUNTER — Ambulatory Visit: Payer: Medicaid Other | Admitting: Obstetrics and Gynecology

## 2018-06-13 ENCOUNTER — Other Ambulatory Visit: Payer: Self-pay | Admitting: Obstetrics and Gynecology

## 2018-06-13 DIAGNOSIS — Z3042 Encounter for surveillance of injectable contraceptive: Secondary | ICD-10-CM

## 2018-06-14 ENCOUNTER — Ambulatory Visit: Payer: Medicaid Other

## 2018-06-14 ENCOUNTER — Other Ambulatory Visit: Payer: Self-pay

## 2018-06-14 ENCOUNTER — Telehealth: Payer: Self-pay | Admitting: Obstetrics and Gynecology

## 2018-06-14 DIAGNOSIS — Z3042 Encounter for surveillance of injectable contraceptive: Secondary | ICD-10-CM

## 2018-06-14 MED ORDER — MEDROXYPROGESTERONE ACETATE 150 MG/ML IM SUSY: 150.0000 mg | PREFILLED_SYRINGE | Freq: Once | INTRAMUSCULAR | 0 refills | Status: DC

## 2018-06-14 NOTE — Telephone Encounter (Signed)
RF sent.

## 2018-06-14 NOTE — Telephone Encounter (Signed)
Patient is reschedule to 06/28/18 for annual and needs [facility] [place]. Please advise

## 2018-06-21 ENCOUNTER — Ambulatory Visit: Payer: Medicaid Other

## 2018-06-21 ENCOUNTER — Other Ambulatory Visit: Payer: Self-pay

## 2018-06-21 ENCOUNTER — Ambulatory Visit (INDEPENDENT_AMBULATORY_CARE_PROVIDER_SITE_OTHER): Payer: Medicaid Other

## 2018-06-21 DIAGNOSIS — Z3042 Encounter for surveillance of injectable contraceptive: Secondary | ICD-10-CM

## 2018-06-21 MED ORDER — MEDROXYPROGESTERONE ACETATE 150 MG/ML IM SUSP
150.0000 mg | Freq: Once | INTRAMUSCULAR | Status: AC
  Administered 2018-06-21: 150 mg via INTRAMUSCULAR

## 2018-06-27 NOTE — Progress Notes (Signed)
Chief Complaint  Patient presents with  . Gynecologic Exam     HPI:      [name] is a 23 y.o. G0P0000 who LMP was No LMP recorded. Patient has had an injection., presents today for her annual examination. Her menses are absent with depo. On depo for cycle control. No BTB/dysmen.  Sex activity: single female partner: contraception: depo and condoms.  Last pap: 04/26/17 Results: normal Hx of STDs: none  There is no FH of breast cancer. There is no FH of ovarian cancer. The patient does  do self-breast exams.  Tobacco use: Occas smoking with alcohol use. Alcohol use: social drinker Exercise: moderately active  She does get adequate calcium and Vitamin D in her diet.  She thinks she had [place] series.   Past Medical History:  Diagnosis Date  . Anxiety   . Asthma   . Depression   . DVT (deep venous thrombosis) (HCC)     Past Surgical History:  Procedure Laterality Date  . TONSILLECTOMY      Family History  Problem Relation Age of Onset  . Colon cancer Mother 46  . Diabetes Father   . Hypertension Father   . Hypertension Sister   . Migraines Sister   . Narcolepsy Sister   . Breast cancer Neg Hx     Social History   Socioeconomic History  . Marital status: Single    Spouse name: Not on file  . Number of children: 0  . Years of education: 58  . Highest education level: High school graduate  Occupational History  . Occupation: Radio producer: [employer]    Comment: [employer]  Social Needs  . Financial resource strain: Not on file  . Food insecurity:    Worry: Never true    Inability: Never true  . Transportation needs:    Medical: No    Non-medical: No  Tobacco Use  . Smoking status: Never Smoker  . Smokeless tobacco: Never Used  Substance and Sexual Activity  . Alcohol use: Yes    Comment: drinks once per month. Will drink between 1-4 drinks at a time.  . Drug use: No  . Sexual activity: Yes    Partners: Male    Birth  control/protection: Injection  Lifestyle  . Physical activity:    Days per week: Not on file    Minutes per session: Not on file  . Stress: Not on file  Relationships  . Social connections:    Talks on phone: Not on file    Gets together: Not on file    Attends religious service: Not on file    Active member of club or organization: Not on file    Attends meetings of clubs or organizations: Not on file    Relationship status: Not on file  . Intimate partner violence:    Fear of current or ex partner: Not on file    Emotionally abused: Not on file    Physically abused: Not on file    Forced sexual activity: Not on file  Other Topics Concern  . Not on file  Social History Narrative  . Not on file     Current Outpatient Medications:  .  albuterol (PROVENTIL HFA;VENTOLIN HFA) 108 (90 Base) MCG/ACT inhaler, Inhale 1-2 puffs into the lungs every 6 (six) hours as needed., Disp: , Rfl:  .  medroxyPROGESTERone Acetate 150 MG/ML SUSY, Inject 1 mL (150 mg total) into the muscle once for  1 dose. To be given every 10 weeks, Disp: 1 Syringe, Rfl: 3   ROS:  Review of Systems  Constitutional: Negative for fatigue, fever and unexpected weight change.  Respiratory: Negative for cough, shortness of breath and wheezing.   Cardiovascular: Negative for chest pain, palpitations and leg swelling.  Gastrointestinal: Negative for blood in stool, constipation, diarrhea, nausea and vomiting.  Endocrine: Negative for cold intolerance, heat intolerance and polyuria.  Genitourinary: Negative for dyspareunia, dysuria, flank pain, frequency, genital sores, hematuria, menstrual problem, pelvic pain, urgency, vaginal bleeding, vaginal discharge and vaginal pain.  Musculoskeletal: Negative for back pain, joint swelling and myalgias.  Skin: Negative for rash.  Neurological: Positive for headaches. Negative for dizziness, syncope, light-headedness and numbness.  Hematological: Negative for adenopathy.   Psychiatric/Behavioral: Positive for agitation and dysphoric mood. Negative for confusion, sleep disturbance and suicidal ideas. The patient is not nervous/anxious.      Objective: BP 120/90   Ht 5' (1.524 m)   Wt 150 lb 9.6 oz (68.3 kg)   BMI 29.41 kg/m    Physical Exam Constitutional:      Appearance: She is well-developed.  Genitourinary:     Vulva, vagina, uterus, right adnexa and left adnexa normal.     No vulval lesion or tenderness noted.     No vaginal discharge, erythema or tenderness.     No cervical motion tenderness or polyp.     Uterus is not enlarged or tender.     No right or left adnexal mass present.     Right adnexa not tender.     Left adnexa not tender.  Neck:     Musculoskeletal: Normal range of motion.     Thyroid: No thyromegaly.  Cardiovascular:     Rate and Rhythm: Normal rate and regular rhythm.     Heart sounds: Normal heart sounds. No murmur.  Pulmonary:     Effort: Pulmonary effort is normal.     Breath sounds: Normal breath sounds.  Chest:     Breasts:        Right: No mass, nipple discharge, skin change or tenderness.        Left: No mass, nipple discharge, skin change or tenderness.  Abdominal:     Palpations: Abdomen is soft.     Tenderness: There is no abdominal tenderness. There is no guarding.  Musculoskeletal: Normal range of motion.  Neurological:     General: No focal deficit present.     Mental Status: She is alert and oriented to person, place, and time.     Cranial Nerves: No cranial nerve deficit.  Skin:    General: Skin is warm and dry.  Psychiatric:        Mood and Affect: Mood normal.        Behavior: Behavior normal.        Thought Content: Thought content normal.        Judgment: Judgment normal.  Vitals signs reviewed.    Assessment/Plan: Encounter for annual routine gynecological examination  Cervical cancer screening - Plan: Cytology - PAP  Screening for STD (sexually transmitted disease) - Plan: Cytology  - PAP  Encounter for surveillance of injectable contraceptive - Depo RF. Cont calcium. - Plan: medroxyPROGESTERone Acetate 150 MG/ML SUSY  Meds ordered this encounter  Medications  . medroxyPROGESTERone Acetate 150 MG/ML SUSY    Sig: Inject 1 mL (150 mg total) into the muscle once for 1 dose. To be given every 10 weeks    Dispense:  1  Syringe    Refill:  3    Order Specific Question:   Supervising Provider    Answer:   Nadara Mustard [[id number]]             GYN counsel adequate intake of calcium and vitamin D, diet and exercise     F/U  Return in about 1 year (around 06/28/2019).  Alicia B. Copland, PA-C 06/28/2018 10:36 AM

## 2018-06-27 NOTE — Patient Instructions (Signed)
I value your feedback and entrusting us with your care. If you get a [facility] patient survey, I would appreciate you taking the time to let us know about your experience today. Thank you! 

## 2018-06-28 ENCOUNTER — Encounter: Payer: Self-pay | Admitting: Obstetrics and Gynecology

## 2018-06-28 ENCOUNTER — Other Ambulatory Visit (HOSPITAL_COMMUNITY)
Admission: RE | Admit: 2018-06-28 | Discharge: 2018-06-28 | Disposition: A | Discharge status: Home / Self Care | Payer: Medicaid Other | Source: Ambulatory Visit | Attending: Obstetrics and Gynecology | Admitting: Obstetrics and Gynecology

## 2018-06-28 ENCOUNTER — Ambulatory Visit (INDEPENDENT_AMBULATORY_CARE_PROVIDER_SITE_OTHER): Payer: Medicaid Other | Admitting: Obstetrics and Gynecology

## 2018-06-28 ENCOUNTER — Other Ambulatory Visit: Payer: Self-pay

## 2018-06-28 VITALS — BP 120/90 | Ht 60.0 in | Wt 150.6 lb

## 2018-06-28 DIAGNOSIS — Z124 Encounter for screening for malignant neoplasm of cervix: Secondary | ICD-10-CM | POA: Insufficient documentation

## 2018-06-28 DIAGNOSIS — Z113 Encounter for screening for infections with a predominantly sexual mode of transmission: Secondary | ICD-10-CM | POA: Diagnosis present

## 2018-06-28 DIAGNOSIS — Z01419 Encounter for gynecological examination (general) (routine) without abnormal findings: Secondary | ICD-10-CM

## 2018-06-28 DIAGNOSIS — Z0000 Encounter for general adult medical examination without abnormal findings: Secondary | ICD-10-CM

## 2018-06-28 DIAGNOSIS — Z3042 Encounter for surveillance of injectable contraceptive: Secondary | ICD-10-CM

## 2018-06-28 MED ORDER — MEDROXYPROGESTERONE ACETATE 150 MG/ML IM SUSY: 150.0000 mg | PREFILLED_SYRINGE | Freq: Once | INTRAMUSCULAR | 3 refills | Status: DC

## 2018-06-29 LAB — CYTOLOGY - PAP
Chlamydia: NEGATIVE
Diagnosis: NEGATIVE
Neisseria Gonorrhea: NEGATIVE

## 2018-07-02 ENCOUNTER — Encounter: Payer: Self-pay | Admitting: Obstetrics and Gynecology

## 2018-09-13 ENCOUNTER — Other Ambulatory Visit: Payer: Self-pay

## 2018-09-13 ENCOUNTER — Ambulatory Visit (INDEPENDENT_AMBULATORY_CARE_PROVIDER_SITE_OTHER): Payer: Medicaid Other

## 2018-09-13 DIAGNOSIS — Z308 Encounter for other contraceptive management: Secondary | ICD-10-CM | POA: Diagnosis not present

## 2018-09-13 MED ORDER — MEDROXYPROGESTERONE ACETATE 150 MG/ML IM SUSP
150.0000 mg | Freq: Once | INTRAMUSCULAR | Status: AC
  Administered 2018-09-13: 150 mg via INTRAMUSCULAR

## 2018-10-16 ENCOUNTER — Other Ambulatory Visit: Payer: Self-pay

## 2018-10-16 ENCOUNTER — Encounter: Payer: Self-pay | Admitting: Emergency Medicine

## 2018-10-16 ENCOUNTER — Emergency Department: Payer: Self-pay

## 2018-10-16 ENCOUNTER — Emergency Department
Admission: EM | Admit: 2018-10-16 | Discharge: 2018-10-17 | Disposition: A | Discharge status: Home / Self Care | Payer: Self-pay | Attending: Emergency Medicine | Admitting: Emergency Medicine

## 2018-10-16 DIAGNOSIS — R1084 Generalized abdominal pain: Secondary | ICD-10-CM

## 2018-10-16 DIAGNOSIS — N939 Abnormal uterine and vaginal bleeding, unspecified: Secondary | ICD-10-CM | POA: Insufficient documentation

## 2018-10-16 DIAGNOSIS — R140 Abdominal distension (gaseous): Secondary | ICD-10-CM | POA: Insufficient documentation

## 2018-10-16 DIAGNOSIS — R635 Abnormal weight gain: Secondary | ICD-10-CM | POA: Insufficient documentation

## 2018-10-16 DIAGNOSIS — J45909 Unspecified asthma, uncomplicated: Secondary | ICD-10-CM | POA: Insufficient documentation

## 2018-10-16 DIAGNOSIS — R1011 Right upper quadrant pain: Secondary | ICD-10-CM | POA: Insufficient documentation

## 2018-10-16 LAB — CBC WITH DIFFERENTIAL/PLATELET
Abs Immature Granulocytes: 0.03 10*3/uL (ref 0.00–0.07)
Basophils Absolute: 0 10*3/uL (ref 0.0–0.1)
Basophils Relative: 0 %
Eosinophils Absolute: 0.1 10*3/uL (ref 0.0–0.5)
Eosinophils Relative: 1 %
HCT: 40.6 % (ref 36.0–46.0)
Hemoglobin: 13.6 g/dL (ref 12.0–15.0)
Immature Granulocytes: 0 %
Lymphocytes Relative: 26 %
Lymphs Abs: 2.2 10*3/uL (ref 0.7–4.0)
MCH: 28.2 pg (ref 26.0–34.0)
MCHC: 33.5 g/dL (ref 30.0–36.0)
MCV: 84.2 fL (ref 80.0–100.0)
Monocytes Absolute: 0.5 10*3/uL (ref 0.1–1.0)
Monocytes Relative: 6 %
Neutro Abs: 5.6 10*3/uL (ref 1.7–7.7)
Neutrophils Relative %: 67 %
Platelets: 324 10*3/uL (ref 150–400)
RBC: 4.82 MIL/uL (ref 3.87–5.11)
RDW: 13.3 % (ref 11.5–15.5)
WBC: 8.5 10*3/uL (ref 4.0–10.5)
nRBC: 0 % (ref 0.0–0.2)

## 2018-10-16 LAB — COMPREHENSIVE METABOLIC PANEL
ALT: 16 U/L (ref 0–44)
AST: 19 U/L (ref 15–41)
Albumin: 4.3 g/dL (ref 3.5–5.0)
Alkaline Phosphatase: 49 U/L (ref 38–126)
Anion gap: 11 (ref 5–15)
BUN: 8 mg/dL (ref 6–20)
CO2: 22 mmol/L (ref 22–32)
Calcium: 9.2 mg/dL (ref 8.9–10.3)
Chloride: 107 mmol/L (ref 98–111)
Creatinine, Ser: 0.71 mg/dL (ref 0.44–1.00)
GFR calc Af Amer: >60 mL/min (ref >60)
GFR calc non Af Amer: >60 mL/min (ref >60)
Glucose, Bld: 106 mg/dL — ABNORMAL HIGH (ref 70–99)
Potassium: 3.6 mmol/L (ref 3.5–5.1)
Sodium: 140 mmol/L (ref 135–145)
Total Bilirubin: 0.5 mg/dL (ref 0.3–1.2)
Total Protein: 7.5 g/dL (ref 6.5–8.1)

## 2018-10-16 LAB — URINALYSIS, COMPLETE (UACMP) WITH MICROSCOPIC
Bilirubin Urine: NEGATIVE
Glucose, UA: NEGATIVE mg/dL
Hgb urine dipstick: NEGATIVE
Ketones, ur: 5 mg/dL — AB
Leukocytes,Ua: NEGATIVE
Nitrite: NEGATIVE
Protein, ur: NEGATIVE mg/dL
Specific Gravity, Urine: 1.024 (ref 1.005–1.030)
pH: 7 (ref 5.0–8.0)

## 2018-10-16 LAB — LIPASE, BLOOD: Lipase: 28 U/L (ref 11–51)

## 2018-10-16 LAB — POCT PREGNANCY, URINE: Preg Test, Ur: NEGATIVE

## 2018-10-16 MED ORDER — KETOROLAC TROMETHAMINE 30 MG/ML IJ SOLN
15.0000 mg | Freq: Once | INTRAMUSCULAR | Status: AC
  Administered 2018-10-16: 23:00:00 15 mg via INTRAVENOUS
  Filled 2018-10-16: qty 1

## 2018-10-16 MED ORDER — DICYCLOMINE HCL 10 MG PO CAPS
20.0000 mg | ORAL_CAPSULE | Freq: Once | ORAL | Status: AC
  Administered 2018-10-16: 20 mg via ORAL
  Filled 2018-10-16: qty 2

## 2018-10-16 MED ORDER — SODIUM CHLORIDE 0.9 % IV BOLUS
1000.0000 mL | Freq: Once | INTRAVENOUS | Status: AC
  Administered 2018-10-16: 1000 mL via INTRAVENOUS

## 2018-10-16 MED ORDER — ONDANSETRON HCL 4 MG/2ML IJ SOLN
4.0000 mg | Freq: Once | INTRAMUSCULAR | Status: AC
  Administered 2018-10-16: 4 mg via INTRAVENOUS
  Filled 2018-10-16: qty 2

## 2018-10-16 NOTE — ED Notes (Signed)
Pt speaking with this RN in NAD, pt reports bloating x 1 week despite using gas pills

## 2018-10-16 NOTE — ED Provider Notes (Signed)
[facility] Emergency Department Provider Note  ____________________________________________   First MD Initiated Contact with Patient 10/16/18 2148     (approximate)  I have reviewed the triage vital signs and the nursing notes.   HISTORY  Chief Complaint Abdominal Pain    HPI Ebony Edwards is a 23 y.o. female here with abdominal pain, bloating, and abdominal distention.  Patient states that for the last week, she is at progressive worsening abdominal pain, distention, and intermittent sharp, stabbing, epigastric and right upper quadrant abdominal pain.  She states that she has had a sensation that she is been bloating and she is gained significant weight.  This all seems to have somewhat started when she was started on an antidepressant.  She denies any fevers or chills.  No diarrhea.  No nausea or vomiting.  No urinary symptoms.  She has occasional vaginal spotting and is on Depakote shots, but denies any persistent vaginal bleeding.  No vaginal discharge.  No adnexal pain.  No specific alleviating or aggravating factors.        Past Medical History:  Diagnosis Date  . Anxiety   . Asthma   . Depression   . DVT (deep venous thrombosis) [facility])     Patient Active Problem List   Diagnosis Date Noted  . History of DVT (deep vein thrombosis) 02/24/2017  . Mild intermittent asthma 02/23/2017  . Depression, recurrent ([facility]) 02/23/2017  . GAD (generalized anxiety disorder) 02/23/2017  . Encounter for other contraceptive management 05/10/2016    Past Surgical History:  Procedure Laterality Date  . TONSILLECTOMY      Prior to Admission medications   Medication Sig Start Date End Date Taking? Authorizing Provider  albuterol (PROVENTIL HFA;VENTOLIN HFA) 108 (90 Base) MCG/ACT inhaler Inhale 1-2 puffs into the lungs every 6 (six) hours as needed.    [provider]  medroxyPROGESTERone Acetate 150 MG/ML SUSY Inject 1 mL (150 mg total) into the  muscle once for 1 dose. To be given every 10 weeks 06/28/18 03/29/98  Copland, Deirdre Evener, PA-C    Allergies Mango flavor [flavoring agent]  Family History  Problem Relation Age of Onset  . Colon cancer Mother 18  . Diabetes Father   . Hypertension Father   . Hypertension Sister   . Migraines Sister   . Narcolepsy Sister   . Breast cancer Neg Hx     Social History Social History   Tobacco Use  . Smoking status: Never Smoker  . Smokeless tobacco: Never Used  Substance Use Topics  . Alcohol use: Yes    Comment: drinks once per month. Will drink between 1-4 drinks at a time.  . Drug use: No    Review of Systems  Review of Systems  Constitutional: Positive for fatigue. Negative for fever.  HENT: Negative for congestion and sore throat.   Eyes: Negative for visual disturbance.  Respiratory: Negative for cough and shortness of breath.   Cardiovascular: Negative for chest pain.  Gastrointestinal: Positive for abdominal distention, abdominal pain and nausea. Negative for diarrhea and vomiting.  Genitourinary: Negative for flank pain.  Musculoskeletal: Negative for back pain and neck pain.  Skin: Negative for rash and wound.  Neurological: Negative for weakness.  All other systems reviewed and are negative.    ____________________________________________  PHYSICAL EXAM:      VITAL SIGNS: ED Triage Vitals  Enc Vitals Group     BP 10/16/18 2019 128/75     Pulse Rate 10/16/18 2150 90  Resp 10/16/18 2019 18     Temp 10/16/18 2019 98.4 F (36.9 C)     Temp src --      SpO2 10/16/18 2019 100 %     Weight 10/16/18 2019 150 lb (68 kg)     Height 10/16/18 2019 5' (1.524 m)     Head Circumference --      Peak Flow --      Pain Score 10/16/18 2019 6     Pain Loc --      Pain Edu? --      Excl. in GC? --      Physical Exam Vitals signs and nursing note reviewed.  Constitutional:      General: She is not in acute distress.    Appearance: She is well-developed.  HENT:      Head: Normocephalic and atraumatic.  Eyes:     Conjunctiva/sclera: Conjunctivae normal.  Neck:     Musculoskeletal: Neck supple.  Cardiovascular:     Rate and Rhythm: Normal rate and regular rhythm.     Heart sounds: Normal heart sounds. No murmur. No friction rub.  Pulmonary:     Effort: Pulmonary effort is normal. No respiratory distress.     Breath sounds: Normal breath sounds. No wheezing or rales.  Abdominal:     General: There is distension.     Palpations: Abdomen is soft.     Tenderness: There is generalized abdominal tenderness and tenderness in the right upper quadrant and epigastric area.  Skin:    General: Skin is warm.     Capillary Refill: Capillary refill takes less than 2 seconds.  Neurological:     Mental Status: She is alert and oriented to person, place, and time.     Motor: No abnormal muscle tone.       ____________________________________________   LABS (all labs ordered are listed, but only abnormal results are displayed)  Labs Reviewed  COMPREHENSIVE METABOLIC PANEL - Abnormal; Notable for the following components:      Result Value   Glucose, Bld 106 (*)    All other components within normal limits  URINALYSIS, COMPLETE (UACMP) WITH MICROSCOPIC - Abnormal; Notable for the following components:   Color, Urine YELLOW (*)    APPearance HAZY (*)    Ketones, ur 5 (*)    Bacteria, UA RARE (*)    All other components within normal limits  CBC WITH DIFFERENTIAL/PLATELET  LIPASE, BLOOD  POCT PREGNANCY, URINE    ____________________________________________  EKG: None ________________________________________  RADIOLOGY All imaging, including plain films, CT scans, and ultrasounds, independently reviewed by me, and interpretations confirmed via formal radiology reads.  ED MD interpretation:   Abdomen x-ray: Negative Ultrasound: Pending  Official radiology report(s): Dg Abd 2 Views  Result Date: 10/16/2018 CLINICAL DATA:  23 year old female  with abdominal pain. EXAM: ABDOMEN - 2 VIEW COMPARISON:  Abdominal radiograph dated 11/26/2005 FINDINGS: There is no bowel dilatation or evidence of obstruction. No free air or radiopaque calculi. The osseous structures and soft tissues are unremarkable. IMPRESSION: Negative. Electronically Signed   By: Elgie Collard M.D.   On: 10/16/2018 22:54    ____________________________________________  PROCEDURES   Procedure(s) performed (including Critical Care):  Procedures  ____________________________________________  INITIAL IMPRESSION / MDM / ASSESSMENT AND PLAN / ED COURSE  As part of my medical decision making, I reviewed the following data within the electronic MEDICAL RECORD NUMBER       *Zarahi Fuerst Kinkaid was evaluated in Emergency Department on 10/16/2018  for the symptoms described in the history of present illness. She was evaluated in the context of the global COVID-19 pandemic, which necessitated consideration that the patient might be at risk for infection with the SARS-CoV-2 virus that causes COVID-19. Institutional protocols and algorithms that pertain to the evaluation of patients at risk for COVID-19 are in a state of rapid change based on information released by regulatory bodies including the CDC and federal and state organizations. These policies and algorithms were followed during the patient's care in the ED.  Some ED evaluations and interventions may be delayed as a result of limited staffing during the pandemic.*      Medical Decision Making: 23 year old female here with diffuse abdominal pain and distention with intermittent right upper quadrant pain.  Differential includes IBS, adverse effect from antidepressant versus Depo shot, viral GI illness, constipation. No signs of SBO - no vomiting, she's having normal BMs and XR unremarkable. Will check U/S to rule out cholecystitis/symptomatic stones. Labs reviewed and are unremarkable. No urinary sx or signs of UTI on urinalysis.  UPT negative. Will plan to f/u U/S, likely d/c if negative.  Patient care transferred to Dr. Manson Passey at the end of my shift. Patient presentation, ED course, and plan of care discussed with review of all pertinent labs and imaging. Please see his/her note for further details regarding further ED course and disposition.   ____________________________________________  FINAL CLINICAL IMPRESSION(S) / ED DIAGNOSES  Final diagnoses:  Abdominal distension  RUQ pain  Abdominal distension  RUQ pain     MEDICATIONS GIVEN DURING THIS VISIT:  Medications  sodium chloride 0.9 % bolus 1,000 mL (has no administration in time range)  ketorolac (TORADOL) 30 MG/ML injection 15 mg (has no administration in time range)  dicyclomine (BENTYL) capsule 20 mg (has no administration in time range)  ondansetron (ZOFRAN) injection 4 mg (has no administration in time range)     ED Discharge Orders    None       Note:  This document was prepared using Dragon voice recognition software and may include unintentional dictation errors.   Shaune Pollack, MD 10/16/18 [phone]

## 2018-10-16 NOTE — ED Triage Notes (Signed)
Patient ambulatory to triage with steady gait, without difficulty or distress noted; pt reports generalized sharp abd pain accomp by bloating x week; denies hx of same

## 2018-10-16 NOTE — ED Notes (Signed)
Report given to Amy RN.

## 2018-10-17 MED ORDER — DICYCLOMINE HCL 20 MG PO TABS: 20.0000 mg | ORAL_TABLET | Freq: Three times a day (TID) | ORAL | 0 refills | Status: DC

## 2018-10-17 MED ORDER — ONDANSETRON 4 MG PO TBDP: 4.0000 mg | ORAL_TABLET | Freq: Three times a day (TID) | ORAL | 0 refills | Status: DC | PRN

## 2018-10-17 NOTE — Discharge Instructions (Addendum)
Try the medications that I prescribed.  Take ibuprofen 600 mg for pain.  If your symptoms not improve, I recommend discussing with your OB/GYN.

## 2018-11-26 ENCOUNTER — Other Ambulatory Visit: Payer: Self-pay | Admitting: *Deleted

## 2018-11-26 DIAGNOSIS — Z20822 Contact with and (suspected) exposure to covid-19: Secondary | ICD-10-CM

## 2018-11-27 LAB — NOVEL CORONAVIRUS, NAA: SARS-CoV-2, NAA: NOT DETECTED

## 2018-12-06 ENCOUNTER — Ambulatory Visit: Payer: Medicaid Other

## 2018-12-11 ENCOUNTER — Other Ambulatory Visit: Payer: Self-pay

## 2018-12-11 ENCOUNTER — Ambulatory Visit (INDEPENDENT_AMBULATORY_CARE_PROVIDER_SITE_OTHER): Payer: Medicaid Other

## 2018-12-11 DIAGNOSIS — Z3042 Encounter for surveillance of injectable contraceptive: Secondary | ICD-10-CM

## 2018-12-11 MED ORDER — MEDROXYPROGESTERONE ACETATE 150 MG/ML IM SUSP
150.0000 mg | Freq: Once | INTRAMUSCULAR | Status: AC
  Administered 2018-12-11: 150 mg via INTRAMUSCULAR

## 2018-12-11 NOTE — Progress Notes (Signed)
Patient presents for Depo Provera injection today within dates. Given IM LUOQ. Patient tolerated well.

## 2019-03-05 ENCOUNTER — Ambulatory Visit (INDEPENDENT_AMBULATORY_CARE_PROVIDER_SITE_OTHER): Payer: Medicaid Other

## 2019-03-05 ENCOUNTER — Other Ambulatory Visit: Payer: Self-pay

## 2019-03-05 DIAGNOSIS — Z3042 Encounter for surveillance of injectable contraceptive: Secondary | ICD-10-CM | POA: Diagnosis not present

## 2019-03-05 MED ORDER — MEDROXYPROGESTERONE ACETATE 150 MG/ML IM SUSP
150.0000 mg | Freq: Once | INTRAMUSCULAR | Status: AC
  Administered 2019-03-05: 150 mg via INTRAMUSCULAR

## 2019-05-27 ENCOUNTER — Ambulatory Visit: Payer: Medicaid Other

## 2019-06-05 ENCOUNTER — Ambulatory Visit: Payer: Medicaid Other

## 2019-06-05 ENCOUNTER — Other Ambulatory Visit: Payer: Self-pay

## 2019-06-05 DIAGNOSIS — Z3202 Encounter for pregnancy test, result negative: Secondary | ICD-10-CM | POA: Diagnosis not present

## 2019-06-05 DIAGNOSIS — Z3042 Encounter for surveillance of injectable contraceptive: Secondary | ICD-10-CM

## 2019-06-05 LAB — POCT URINE PREGNANCY: Preg Test, Ur: NEGATIVE

## 2019-06-05 MED ORDER — MEDROXYPROGESTERONE ACETATE 150 MG/ML IM SUSP
150.0000 mg | Freq: Once | INTRAMUSCULAR | Status: AC
  Administered 2019-06-05: 150 mg via INTRAMUSCULAR

## 2019-06-05 NOTE — Progress Notes (Signed)
Patient presents today for Depo Provera injection outside of date range. Patient not currently on menses. Pregnancy test performed per protocol (negative).Given IM LUOQ. Patient tolerated well. 

## 2019-07-01 NOTE — Progress Notes (Signed)
Chief Complaint  Patient presents with  . Gynecologic Exam     HPI:      Ebony Edwards is a 24 y.o. G0P0000 who LMP was No LMP recorded. Patient has had an injection., presents today for her annual examination. Her menses are absent with depo. On depo for cycle control. No BTB/occas dysmen.  Sex activity: single female partner: contraception: depo. Has been having achy pelvic pain during sex and occas red bleeding for the past yr. Has tried lubricants without relief. Sx resolve after sex activity ceases. Bleeding only lasts a few min. No pelvic pain sx if pt not sex active. No vag, urin, GI sx. Has BM daily.  Last pap: 06/28/18 Results: normal Hx of STDs: none  There is no FH of breast cancer. There is no FH of ovarian cancer. The patient does  do self-breast exams. There is a FH of colon cancer in her mom in her 7s. Pt will need to start colonoscopies in her 58s.  Tobacco use: vapes daily Alcohol use: social drinker No drug use Exercise: moderately active  She does not get adequate calcium and Vitamin D in her diet.  She thinks she had [place] series.   Past Medical History:  Diagnosis Date  . Anxiety   . Asthma   . Depression   . DVT (deep venous thrombosis) ([place])     Past Surgical History:  Procedure Laterality Date  . TONSILLECTOMY      Family History  Problem Relation Age of Onset  . Colon cancer Mother 68  . Diabetes Father   . Hypertension Father   . Hypertension Sister   . Migraines Sister   . Narcolepsy Sister   . Breast cancer Neg Hx     Social History   Socioeconomic History  . Marital status: Single    Spouse name: Not on file  . Number of children: 0  . Years of education: 43  . Highest education level: High school graduate  Occupational History  . Occupation: Archivist: [employer]    Comment: [employer]  Tobacco Use  . Smoking status: Never Smoker  . Smokeless tobacco: Never Used  Substance and Sexual Activity  .  Alcohol use: Yes    Comment: drinks once per month. Will drink between 1-4 drinks at a time.  . Drug use: No  . Sexual activity: Yes    Partners: Male    Birth control/protection: Injection  Other Topics Concern  . Not on file  Social History Narrative  . Not on file   Social Determinants of Health   Financial Resource Strain:   . Difficulty of Paying Living Expenses:   Food Insecurity:   . Worried About Charity fundraiser in the Last Year:   . Arboriculturist in the Last Year:   Transportation Needs:   . Film/video editor (Medical):   Marland Kitchen Lack of Transportation (Non-Medical):   Physical Activity:   . Days of Exercise per Week:   . Minutes of Exercise per Session:   Stress:   . Feeling of Stress :   Social Connections:   . Frequency of Communication with Friends and Family:   . Frequency of Social Gatherings with Friends and Family:   . Attends Religious Services:   . Active Member of Clubs or Organizations:   . Attends Archivist Meetings:   Marland Kitchen Marital Status:   Intimate Partner Violence:   . Fear of  Current or Ex-Partner:   . Emotionally Abused:   Marland Kitchen Physically Abused:   . Sexually Abused:      Current Outpatient Medications:  .  albuterol (PROVENTIL HFA;VENTOLIN HFA) 108 (90 Base) MCG/ACT inhaler, Inhale 1-2 puffs into the lungs every 6 (six) hours as needed., Disp: , Rfl:  .  dicyclomine (BENTYL) 20 MG tablet, Take 1 tablet (20 mg total) by mouth 4 (four) times daily -  before meals and at bedtime for 7 days., Disp: 28 tablet, Rfl: 0 .  medroxyPROGESTERone Acetate 150 MG/ML SUSY, Inject 1 mL (150 mg total) into the muscle once for 1 dose. To be given every 10 weeks, Disp: 1 mL, Rfl: 4 .  ondansetron (ZOFRAN ODT) 4 MG disintegrating tablet, Take 1 tablet (4 mg total) by mouth every 8 (eight) hours as needed for nausea or vomiting. (Patient not taking: Reported on 07/02/2019), Disp: 20 tablet, Rfl: 0   ROS:  Review of Systems  Constitutional: Negative  for fatigue, fever and unexpected weight change.  Respiratory: Negative for cough, shortness of breath and wheezing.   Cardiovascular: Negative for chest pain, palpitations and leg swelling.  Gastrointestinal: Negative for blood in stool, constipation, diarrhea, nausea and vomiting.  Endocrine: Negative for cold intolerance, heat intolerance and polyuria.  Genitourinary: Positive for dyspareunia and vaginal bleeding. Negative for dysuria, flank pain, frequency, genital sores, hematuria, menstrual problem, pelvic pain, urgency, vaginal discharge and vaginal pain.  Musculoskeletal: Negative for back pain, joint swelling and myalgias.  Skin: Negative for rash.  Neurological: Positive for headaches. Negative for dizziness, syncope, light-headedness and numbness.  Hematological: Negative for adenopathy.  Psychiatric/Behavioral: Positive for agitation. Negative for confusion, dysphoric mood, sleep disturbance and suicidal ideas. The patient is not nervous/anxious.      Objective: BP 110/70   Ht 5' (1.524 m)   Wt 123 lb (55.8 kg)   BMI 24.02 kg/m    Physical Exam Constitutional:      Appearance: She is well-developed.  Genitourinary:     Vulva and vagina normal.     No vulval lesion or tenderness noted.     No vaginal discharge, erythema or tenderness.     No cervical friability, bleeding or polyp.     Uterus is tender.     Uterus is not enlarged.     No right or left adnexal mass present.     Right adnexa tender.     Left adnexa tender.     Genitourinary Comments: VAGINAL AND BIMANUAL EXAM CAUSE SIMILAR SX AS SEX  Neck:     Thyroid: No thyromegaly.  Cardiovascular:     Rate and Rhythm: Normal rate and regular rhythm.     Heart sounds: Normal heart sounds. No murmur.  Pulmonary:     Effort: Pulmonary effort is normal.     Breath sounds: Normal breath sounds.  Chest:     Breasts:        Right: No mass, nipple discharge, skin change or tenderness.        Left: No mass, nipple  discharge, skin change or tenderness.  Abdominal:     Palpations: Abdomen is soft.     Tenderness: There is no abdominal tenderness. There is no guarding.  Musculoskeletal:        General: Normal range of motion.     Cervical back: Normal range of motion.  Neurological:     General: No focal deficit present.     Mental Status: She is alert and oriented to person, place,  and time.     Cranial Nerves: No cranial nerve deficit.  Skin:    General: Skin is warm and dry.  Psychiatric:        Mood and Affect: Mood normal.        Behavior: Behavior normal.        Thought Content: Thought content normal.        Judgment: Judgment normal.  Vitals reviewed.    Assessment/Plan: Encounter for annual routine gynecological examination  Cervical cancer screening - Plan: Cytology - PAP  Screening for STD (sexually transmitted disease) - Plan: Cytology - PAP,   Encounter for surveillance of injectable contraceptive - Plan: medroxyPROGESTERone Acetate 150 MG/ML SUSY; Depo RF. Add calcium/Vit D supp.   Dyspareunia in female--tender on exam. Rule out STDs. If neg, will check GYN u/s.   Postcoital bleeding--neg exam. Cx not friable. Rule out STDs. If neg, will try Rx doxy.   Family history of colon cancer in mother--doesn't qualify for cancer genetic testing but should start colonoscopies 10 yrs prior to her dx. No GI sx currently.   Meds ordered this encounter  Medications  . medroxyPROGESTERone Acetate 150 MG/ML SUSY    Sig: Inject 1 mL (150 mg total) into the muscle once for 1 dose. To be given every 10 weeks    Dispense:  1 mL    Refill:  4    Order Specific Question:   Supervising Provider    Answer:   Nadara Mustard [[id number]]             GYN counsel adequate intake of calcium and vitamin D, diet and exercise     F/U  Return in about 1 year (around 07/01/2020).  Astra Gregg B. Keilana Morlock, PA-C 07/02/2019 9:17 AM

## 2019-07-02 ENCOUNTER — Encounter: Payer: Self-pay | Admitting: Obstetrics and Gynecology

## 2019-07-02 ENCOUNTER — Other Ambulatory Visit: Payer: Self-pay

## 2019-07-02 ENCOUNTER — Ambulatory Visit (INDEPENDENT_AMBULATORY_CARE_PROVIDER_SITE_OTHER): Payer: Medicaid Other | Admitting: Obstetrics and Gynecology

## 2019-07-02 ENCOUNTER — Other Ambulatory Visit (HOSPITAL_COMMUNITY)
Admission: RE | Admit: 2019-07-02 | Discharge: 2019-07-02 | Disposition: A | Discharge status: Home / Self Care | Payer: Medicaid Other | Source: Ambulatory Visit | Attending: Obstetrics and Gynecology | Admitting: Obstetrics and Gynecology

## 2019-07-02 VITALS — BP 110/70 | Ht 60.0 in | Wt 123.0 lb

## 2019-07-02 DIAGNOSIS — N930 Postcoital and contact bleeding: Secondary | ICD-10-CM

## 2019-07-02 DIAGNOSIS — Z124 Encounter for screening for malignant neoplasm of cervix: Secondary | ICD-10-CM

## 2019-07-02 DIAGNOSIS — Z01419 Encounter for gynecological examination (general) (routine) without abnormal findings: Secondary | ICD-10-CM

## 2019-07-02 DIAGNOSIS — Z3042 Encounter for surveillance of injectable contraceptive: Secondary | ICD-10-CM | POA: Diagnosis not present

## 2019-07-02 DIAGNOSIS — N941 Unspecified dyspareunia: Secondary | ICD-10-CM

## 2019-07-02 DIAGNOSIS — Z113 Encounter for screening for infections with a predominantly sexual mode of transmission: Secondary | ICD-10-CM

## 2019-07-02 DIAGNOSIS — Z800 Family history of malignant neoplasm of digestive organs: Secondary | ICD-10-CM | POA: Insufficient documentation

## 2019-07-02 MED ORDER — MEDROXYPROGESTERONE ACETATE 150 MG/ML IM SUSY: 150.0000 mg | PREFILLED_SYRINGE | Freq: Once | INTRAMUSCULAR | 4 refills | Status: DC

## 2019-07-02 NOTE — Patient Instructions (Signed)
I value your feedback and entrusting us with your care. If you get a [place] patient survey, I would appreciate you taking the time to let us know about your experience today. Thank you!  As of January 03, 2019, your lab results will be released to your MyChart immediately, before I even have a chance to see them. Please give me time to review them and contact you if there are any abnormalities. Thank you for your patience.  

## 2019-07-03 LAB — CYTOLOGY - PAP
Chlamydia: NEGATIVE
Comment: NEGATIVE
Comment: NORMAL
Neisseria Gonorrhea: NEGATIVE

## 2019-07-04 NOTE — Addendum Note (Signed)
Addended by: Althea Grimmer B on: 07/04/2019 09:39 AM   Modules accepted: Orders

## 2019-07-08 ENCOUNTER — Other Ambulatory Visit: Payer: Self-pay

## 2019-07-08 ENCOUNTER — Ambulatory Visit: Payer: Self-pay

## 2019-07-08 DIAGNOSIS — N941 Unspecified dyspareunia: Secondary | ICD-10-CM

## 2019-07-08 DIAGNOSIS — N930 Postcoital and contact bleeding: Secondary | ICD-10-CM

## 2019-07-09 ENCOUNTER — Telehealth: Payer: Self-pay | Admitting: Obstetrics and Gynecology

## 2019-07-09 MED ORDER — DOXYCYCLINE HYCLATE 100 MG PO CAPS: 100.0000 mg | ORAL_CAPSULE | Freq: Two times a day (BID) | ORAL | 0 refills | Status: DC

## 2019-07-09 NOTE — Telephone Encounter (Signed)
Pt aware of neg GYN u/s for dyspareunia/postcoital bleeding. May be GI related due to depo, although no other GI sx. Neg STD testing. Will try Rx doxy. Rx eRxd. F/u prn.  Pt also with heat intolerance/feeling hot/night sweats. Had neg labs with PCP. Given severity and frequency of sx, doubt depo related. F/u with PCP prn.

## 2019-08-28 ENCOUNTER — Other Ambulatory Visit: Payer: Self-pay

## 2019-08-28 ENCOUNTER — Ambulatory Visit (INDEPENDENT_AMBULATORY_CARE_PROVIDER_SITE_OTHER): Payer: Medicaid Other

## 2019-08-28 DIAGNOSIS — Z3042 Encounter for surveillance of injectable contraceptive: Secondary | ICD-10-CM

## 2019-08-28 MED ORDER — MEDROXYPROGESTERONE ACETATE 150 MG/ML IM SUSP
150.0000 mg | Freq: Once | INTRAMUSCULAR | Status: AC
  Administered 2019-08-28: 150 mg via INTRAMUSCULAR

## 2019-11-20 ENCOUNTER — Ambulatory Visit: Payer: Medicaid Other

## 2019-11-21 ENCOUNTER — Ambulatory Visit (INDEPENDENT_AMBULATORY_CARE_PROVIDER_SITE_OTHER): Payer: Medicaid Other

## 2019-11-21 ENCOUNTER — Other Ambulatory Visit: Payer: Self-pay

## 2019-11-21 DIAGNOSIS — Z3042 Encounter for surveillance of injectable contraceptive: Secondary | ICD-10-CM

## 2019-11-21 MED ORDER — MEDROXYPROGESTERONE ACETATE 150 MG/ML IM SUSP
150.0000 mg | Freq: Once | INTRAMUSCULAR | Status: AC
  Administered 2019-11-21: 150 mg via INTRAMUSCULAR

## 2019-11-21 NOTE — Progress Notes (Signed)
Pt here for depo which was given IM right glut.  NDC# [address]-2 

## 2020-02-13 ENCOUNTER — Ambulatory Visit (INDEPENDENT_AMBULATORY_CARE_PROVIDER_SITE_OTHER): Payer: Medicaid Other

## 2020-02-13 ENCOUNTER — Other Ambulatory Visit: Payer: Self-pay

## 2020-02-13 DIAGNOSIS — Z308 Encounter for other contraceptive management: Secondary | ICD-10-CM

## 2020-02-13 MED ORDER — MEDROXYPROGESTERONE ACETATE 150 MG/ML IM SUSP
150.0000 mg | Freq: Once | INTRAMUSCULAR | Status: AC
  Administered 2020-02-13: 150 mg via INTRAMUSCULAR

## 2020-05-07 ENCOUNTER — Ambulatory Visit: Payer: Medicaid Other

## 2020-05-29 IMAGING — CR DG CHEST 2V
1 series · 2 of 2 positions shown · non-contrast
Comparison: None.

CLINICAL DATA: Shortness of breath

EXAM:
CHEST - 2 VIEW

[Series 1: dg chest 2 view · 0.14mm/px · 2 of 2 slices shown]
[im 1/2]
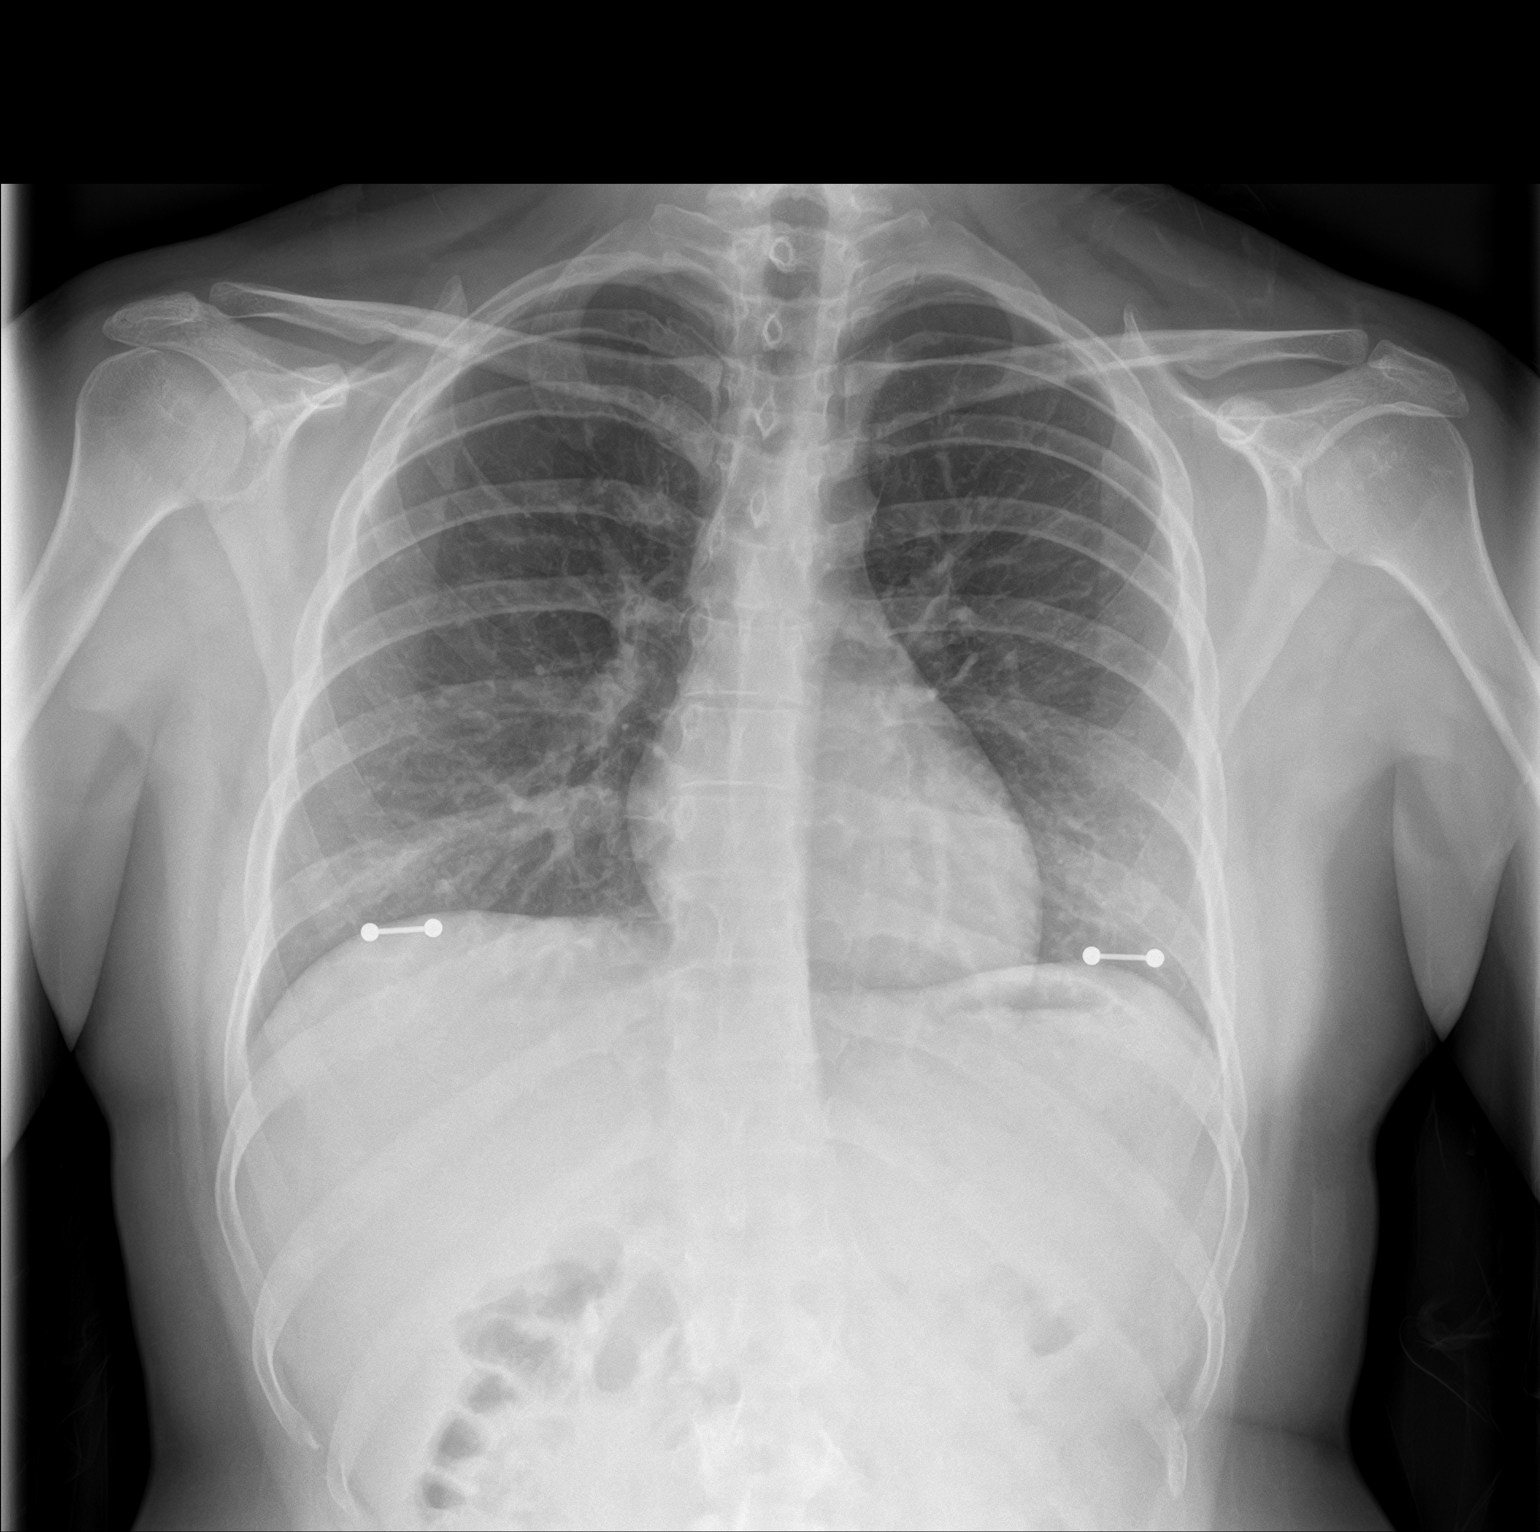
[im 2/2]
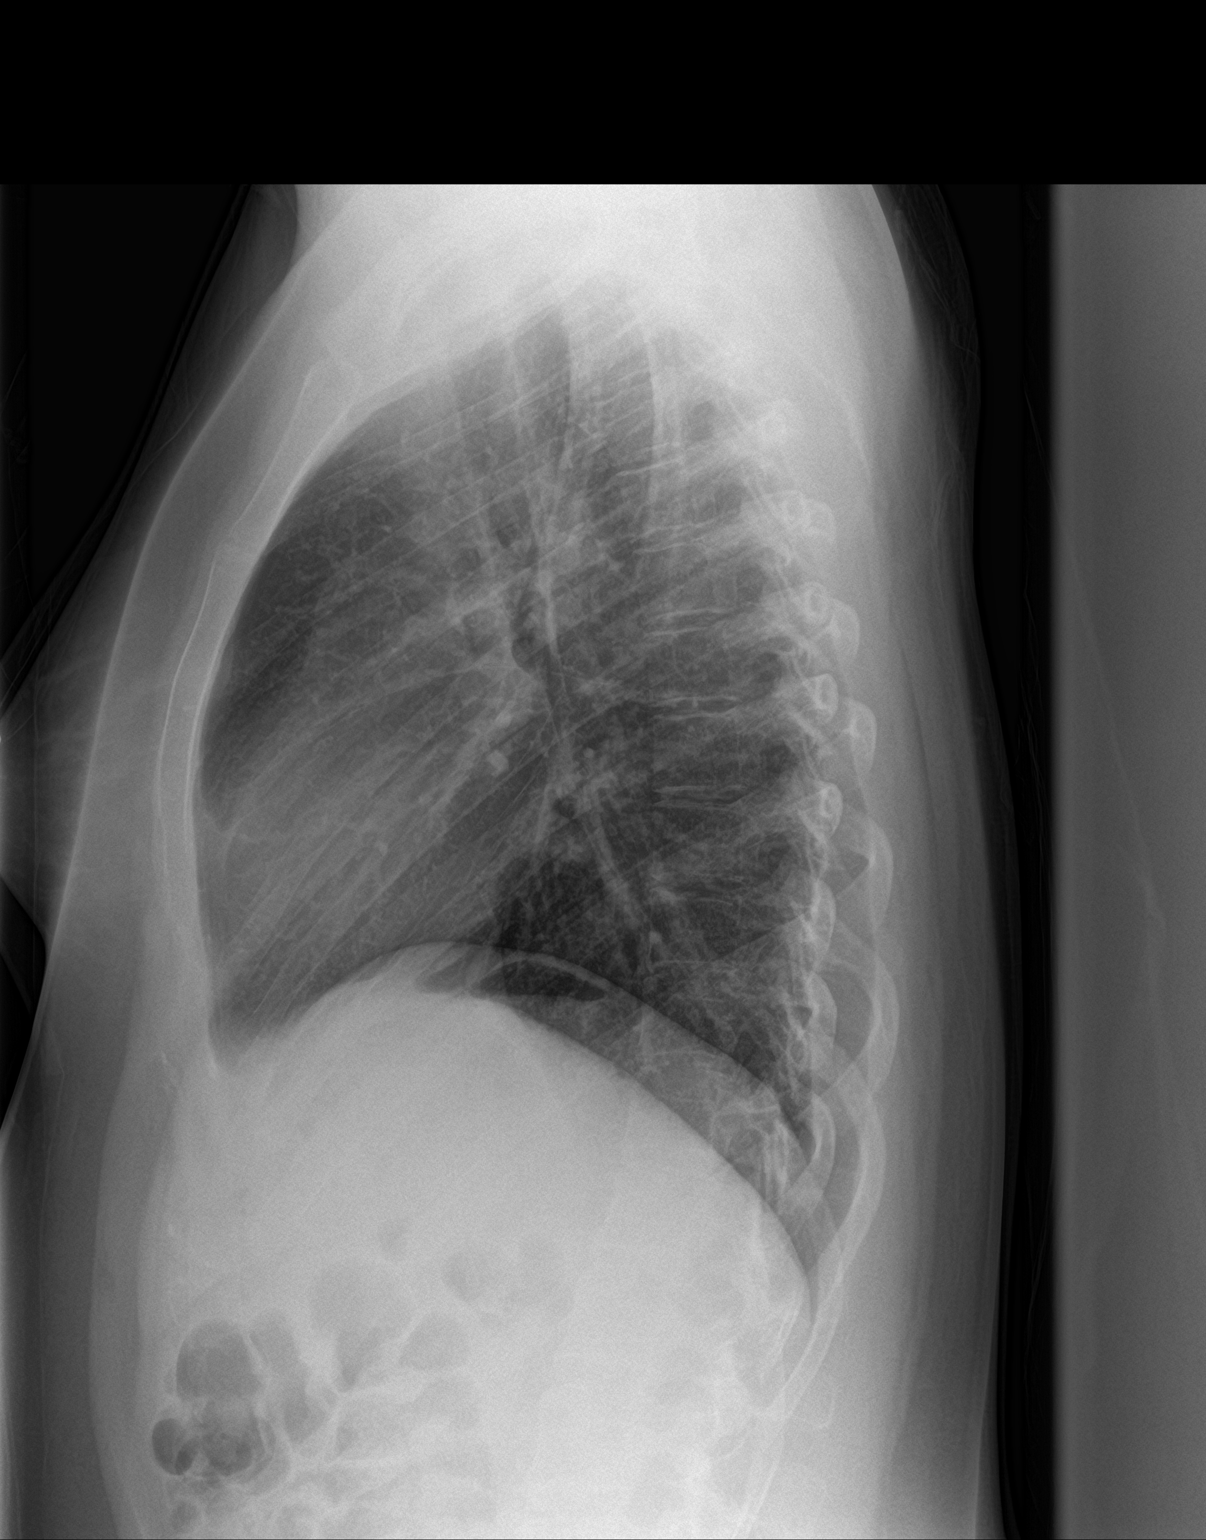

[2 of 2 positions shown; findings below may reference images not displayed]

FINDINGS: Normal heart size and mediastinal contours. No acute infiltrate or
edema. No effusion or pneumothorax. No acute osseous findings.
IMPRESSION: Negative chest.

## 2020-06-16 ENCOUNTER — Ambulatory Visit
Admission: EM | Admit: 2020-06-16 | Discharge: 2020-06-16 | Disposition: A | Discharge status: Home / Self Care | Payer: Medicaid Other | Attending: Physician Assistant | Admitting: Physician Assistant

## 2020-06-16 ENCOUNTER — Other Ambulatory Visit: Payer: Self-pay

## 2020-06-16 ENCOUNTER — Encounter: Payer: Self-pay | Admitting: Emergency Medicine

## 2020-06-16 DIAGNOSIS — M79671 Pain in right foot: Secondary | ICD-10-CM

## 2020-06-16 DIAGNOSIS — M722 Plantar fascial fibromatosis: Secondary | ICD-10-CM

## 2020-06-16 MED ORDER — MELOXICAM 15 MG PO TABS: 15.0000 mg | ORAL_TABLET | Freq: Every day | ORAL | 1 refills | Status: AC

## 2020-06-16 NOTE — ED Triage Notes (Signed)
Patient c/o right foot pain on the bottom of her foot that started this morning. Denies injury.

## 2020-06-16 NOTE — ED Provider Notes (Signed)
[facility]    CSN: [id number] Arrival date & time: 06/16/20  1757      History   Chief Complaint Chief Complaint  Patient presents with  . Foot Pain    HPI Ebony Edwards is a 25 y.o. female presenting for onset of plantar right foot pain earlier today.  She denies any injury.  States she woke up and her foot was hurting.  Patient states that she works 3 different jobs and is on her feet often.  She denies being a runner.  Patient says she has taken 400 mg of ibuprofen and it did not help.  She denies any associated numbness, weakness or tingling.  Pain is worse when she is dorsiflexing her foot and when she is bearing weight on her foot.  She has not tried ice yet.  Patient states that she has missed work today due to the discomfort.  She states she plans to go back tomorrow.  She denies similar problem in the past.  No other concerns.  HPI  Past Medical History:  Diagnosis Date  . Anxiety   . Asthma   . Depression   . DVT (deep venous thrombosis) [facility])     Patient Active Problem List   Diagnosis Date Noted  . Dyspareunia in female 07/02/2019  . Family history of colon cancer 07/02/2019  . History of DVT (deep vein thrombosis) 02/24/2017  . Mild intermittent asthma 02/23/2017  . Depression, recurrent (HCC) 02/23/2017  . GAD (generalized anxiety disorder) 02/23/2017  . Encounter for other contraceptive management 05/10/2016    Past Surgical History:  Procedure Laterality Date  . TONSILLECTOMY      OB History    Gravida  0   Para  0   Term  0   Preterm  0   AB  0   Living  0     SAB  0   IAB  0   Ectopic  0   Multiple  0   Live Births  0            Home Medications    Prior to Admission medications   Medication Sig Start Date End Date Taking? Authorizing Provider  meloxicam (MOBIC) 15 MG tablet Take 1 tablet (15 mg total) by mouth daily for 15 days. 06/16/20 07/01/20 Yes Eusebio Friendly B, PA-C  albuterol (PROVENTIL HFA;VENTOLIN  HFA) 108 (90 Base) MCG/ACT inhaler Inhale 1-2 puffs into the lungs every 6 (six) hours as needed.    [provider]  dicyclomine (BENTYL) 20 MG tablet Take 1 tablet (20 mg total) by mouth 4 (four) times daily -  before meals and at bedtime for 7 days. 10/17/18 10/24/18  Shaune Pollack, MD  medroxyPROGESTERone Acetate 150 MG/ML SUSY Inject 1 mL (150 mg total) into the muscle once for 1 dose. To be given every 10 weeks 07/02/19 07/02/19  Copland, Ilona Sorrel, PA-C    Family History Family History  Problem Relation Age of Onset  . Colon cancer Mother 80  . Diabetes Father   . Hypertension Father   . Hypertension Sister   . Migraines Sister   . Narcolepsy Sister   . Breast cancer Neg Hx     Social History Social History   Tobacco Use  . Smoking status: Never Smoker  . Smokeless tobacco: Never Used  Vaping Use  . Vaping Use: Some days  Substance Use Topics  . Alcohol use: Yes    Comment: drinks once per month. Will drink  between 1-4 drinks at a time.  . Drug use: No     Allergies   Mango flavor [flavoring agent]   Review of Systems Review of Systems  Constitutional: Negative for fatigue and fever.  Cardiovascular: Negative for leg swelling.  Musculoskeletal: Positive for arthralgias and gait problem. Negative for joint swelling.  Skin: Negative for color change, rash and wound.  Neurological: Negative for weakness and numbness.     Physical Exam Triage Vital Signs ED Triage Vitals  Enc Vitals Group     BP 06/16/20 1829 119/81     Pulse Rate 06/16/20 1829 86     Resp 06/16/20 1829 18     Temp 06/16/20 1829 98.1 F (36.7 C)     Temp Source 06/16/20 1829 Oral     SpO2 06/16/20 1829 100 %     Weight 06/16/20 1828 135 lb (61.2 kg)     Height 06/16/20 1828 5' (1.524 m)     Head Circumference --      Peak Flow --      Pain Score 06/16/20 1828 7     Pain Loc --      Pain Edu? --      Excl. in GC? --    No data found.  Updated Vital Signs BP 119/81 (BP  Location: Right Arm)   Pulse 86   Temp 98.1 F (36.7 C) (Oral)   Resp 18   Ht 5' (1.524 m)   Wt 135 lb (61.2 kg)   SpO2 100%   BMI 26.37 kg/m       Physical Exam Vitals and nursing note reviewed.  Constitutional:      General: She is not in acute distress.    Appearance: Normal appearance. She is not ill-appearing or toxic-appearing.  HENT:     Head: Normocephalic and atraumatic.  Eyes:     General: No scleral icterus.       Right eye: No discharge.        Left eye: No discharge.     Conjunctiva/sclera: Conjunctivae normal.  Cardiovascular:     Rate and Rhythm: Normal rate and regular rhythm.     Pulses: Normal pulses.     Heart sounds: Normal heart sounds.  Pulmonary:     Effort: Pulmonary effort is normal. No respiratory distress.     Breath sounds: Normal breath sounds.  Musculoskeletal:     Cervical back: Neck supple.     Right foot: Normal range of motion. Tenderness (TTP anterior medial heel and plantar arch) present. No swelling.     Comments: Painful dorsiflexion of R foot  Skin:    General: Skin is dry.  Neurological:     General: No focal deficit present.     Mental Status: She is alert. Mental status is at baseline.     Motor: No weakness.     Gait: Gait abnormal.  Psychiatric:        Mood and Affect: Mood normal.        Behavior: Behavior normal.        Thought Content: Thought content normal.      UC Treatments / Results  Labs (all labs ordered are listed, but only abnormal results are displayed) Labs Reviewed - No data to display  EKG   Radiology No results found.  Procedures Procedures (including critical care time)  Medications Ordered in UC Medications - No data to display  Initial Impression / Assessment and Plan / UC Course  I have reviewed  the triage vital signs and the nursing notes.  Pertinent labs & imaging results that were available during my care of the patient were reviewed by me and considered in my medical decision  making (see chart for details).   25 year old female presenting for right foot pain that started today.  Her clinical presentation is most consistent with plantar fasciitis.  Supportive care advised at this time.  I sent meloxicam and advised RICE, over-the-counter Tylenol, night splints, and reviewed stretching exercises with patient.  Advised to follow-up with [facility] if not improving over the next 7 to 10 days or for worsening symptoms.  Work note given.   Final Clinical Impressions(s) / UC Diagnoses   Final diagnoses:  Plantar fasciitis, right  Right foot pain     Discharge Instructions     Your foot pain is likely due to plantar fasciitis.  Please see the handout and stretches attached.  I have sent an anti-inflammatory to the pharmacy.  You can also take over-the-counter Tylenol for more pain relief if you need it and purchase over-the-counter Voltaren gel and apply this area if needed as well.  Ice the area multiple times throughout the day.  Try to stretch often.  We also talked about rolling your foot over a tennis ball.  There are also specific splints that you can wear at night to help.  You can get these over-the-counter.  Also you can purchase an over-the-counter Ace wrap.  If symptoms or not improving over the next 7 to 10 days you should be seen again.  I would consider going to [facility] walk-in urgent care in [facility].    ED Prescriptions    Medication Sig Dispense Auth. Provider   meloxicam (MOBIC) 15 MG tablet Take 1 tablet (15 mg total) by mouth daily for 15 days. 15 tablet Gareth Morgan     PDMP not reviewed this encounter.   Shirlee Latch, PA-C 06/16/20 [phone]

## 2020-06-16 NOTE — Discharge Instructions (Addendum)
Your foot pain is likely due to plantar fasciitis.  Please see the handout and stretches attached.  I have sent an anti-inflammatory to the pharmacy.  You can also take over-the-counter Tylenol for more pain relief if you need it and purchase over-the-counter Voltaren gel and apply this area if needed as well.  Ice the area multiple times throughout the day.  Try to stretch often.  We also talked about rolling your foot over a tennis ball.  There are also specific splints that you can wear at night to help.  You can get these over-the-counter.  Also you can purchase an over-the-counter Ace wrap.  If symptoms or not improving over the next 7 to 10 days you should be seen again.  I would consider going to [facility] walk-in urgent care in [facility].

## 2020-12-08 ENCOUNTER — Ambulatory Visit (INDEPENDENT_AMBULATORY_CARE_PROVIDER_SITE_OTHER): Payer: Medicaid Other | Admitting: Obstetrics and Gynecology

## 2020-12-08 ENCOUNTER — Encounter: Payer: Self-pay | Admitting: Obstetrics and Gynecology

## 2020-12-08 ENCOUNTER — Other Ambulatory Visit: Payer: Self-pay

## 2020-12-08 ENCOUNTER — Other Ambulatory Visit (HOSPITAL_COMMUNITY)
Admission: RE | Admit: 2020-12-08 | Discharge: 2020-12-08 | Disposition: A | Discharge status: Home / Self Care | Payer: Medicaid Other | Source: Ambulatory Visit | Attending: Obstetrics and Gynecology | Admitting: Obstetrics and Gynecology

## 2020-12-08 VITALS — BP 100/60 | Ht 60.0 in | Wt 155.0 lb

## 2020-12-08 DIAGNOSIS — Z01419 Encounter for gynecological examination (general) (routine) without abnormal findings: Secondary | ICD-10-CM

## 2020-12-08 DIAGNOSIS — Z113 Encounter for screening for infections with a predominantly sexual mode of transmission: Secondary | ICD-10-CM | POA: Insufficient documentation

## 2020-12-08 DIAGNOSIS — Z30011 Encounter for initial prescription of contraceptive pills: Secondary | ICD-10-CM | POA: Diagnosis not present

## 2020-12-08 DIAGNOSIS — Z124 Encounter for screening for malignant neoplasm of cervix: Secondary | ICD-10-CM | POA: Diagnosis present

## 2020-12-08 DIAGNOSIS — Z1151 Encounter for screening for human papillomavirus (HPV): Secondary | ICD-10-CM

## 2020-12-08 DIAGNOSIS — R87612 Low grade squamous intraepithelial lesion on cytologic smear of cervix (LGSIL): Secondary | ICD-10-CM | POA: Diagnosis not present

## 2020-12-08 DIAGNOSIS — Z3202 Encounter for pregnancy test, result negative: Secondary | ICD-10-CM

## 2020-12-08 DIAGNOSIS — N912 Amenorrhea, unspecified: Secondary | ICD-10-CM | POA: Diagnosis not present

## 2020-12-08 LAB — POCT URINE PREGNANCY: Preg Test, Ur: NEGATIVE

## 2020-12-08 MED ORDER — NORETHINDRONE 0.35 MG PO TABS: 1.0000 | ORAL_TABLET | Freq: Every day | ORAL | 3 refills | Status: AC

## 2020-12-08 NOTE — Progress Notes (Signed)
Chief Complaint  Patient presents with   Gynecologic Exam    No concerns     HPI:      [name] is a 25 y.o. G0P0000 who LMP was No LMP recorded. (Menstrual status: Irregular Periods)., presents today for her annual examination. Her menses are absent since stopping depo 1/22 (last injection). On depo for cycle control for several yrs; pt thought it would be healthier to have period. No BTB/occas dysmen. Hasn't resumed menses off depo yet, except 30 min spotting one day.   Sex activity: single female partner: contraception: none; stopped depo 1/22; no menses since. Would like to change to different BC. Hx of DVTs in past in chart but pt never did anticoagulation meds. Pt unsure who diagnosed this for her and wasn't told much, but it's now in her chart; has had doppler u/s in 2018 and 2019 that were neg.   Last pap: 07/02/19 Results: LGISL, repeat pap due today Hx of STDs: none   There is no FH of breast cancer. There is no FH of ovarian cancer. The patient does  do self-breast exams. There is a FH of colon cancer in her mom in her 22s. Pt will need to start colonoscopies in her 28s.   Tobacco use: none Alcohol use: social drinker No drug use Exercise: moderately active   She does not get adequate calcium but not Vitamin D in her diet.  She thinks she had [place] series.    Past Medical History:  Diagnosis Date   Anxiety    Asthma    Depression    DVT (deep venous thrombosis) (HCC)     Past Surgical History:  Procedure Laterality Date   TONSILLECTOMY      Family History  Problem Relation Age of Onset   Colon cancer Mother 32   Diabetes Father    Hypertension Father    Hypertension Sister    Migraines Sister    Narcolepsy Sister    Breast cancer Neg Hx     Social History   Socioeconomic History   Marital status: Single    Spouse name: Not on file   Number of children: 0   Years of education: 12   Highest education level: High school graduate   Occupational History   Occupation: Archivist: [employer]    Comment: [employer]  Tobacco Use   Smoking status: Never   Smokeless tobacco: Never  Vaping Use   Vaping Use: Some days  Substance and Sexual Activity   Alcohol use: Yes    Comment: drinks once per month. Will drink between 1-4 drinks at a time.   Drug use: No   Sexual activity: Yes    Partners: Male    Birth control/protection: None  Other Topics Concern   Not on file  Social History Narrative   Not on file   Social Determinants of Health   Financial Resource Strain: Not on file  Food Insecurity: Not on file  Transportation Needs: Not on file  Physical Activity: Not on file  Stress: Not on file  Social Connections: Not on file  Intimate Partner Violence: Not on file     Current Outpatient Medications:    albuterol (PROVENTIL HFA;VENTOLIN HFA) 108 (90 Base) MCG/ACT inhaler, Inhale 1-2 puffs into the lungs every 6 (six) hours as needed., Disp: , Rfl:    norethindrone (MICRONOR) 0.35 MG tablet, Take 1 tablet (0.35 mg total) by mouth daily., Disp: 84 tablet, Rfl:  3   ROS:  Review of Systems  Constitutional:  Negative for fatigue, fever and unexpected weight change.  Respiratory:  Negative for cough, shortness of breath and wheezing.   Cardiovascular:  Negative for chest pain, palpitations and leg swelling.  Gastrointestinal:  Negative for blood in stool, constipation, diarrhea, nausea and vomiting.  Endocrine: Negative for cold intolerance, heat intolerance and polyuria.  Genitourinary:  Negative for dyspareunia, dysuria, flank pain, frequency, genital sores, hematuria, menstrual problem, pelvic pain, urgency, vaginal bleeding, vaginal discharge and vaginal pain.  Musculoskeletal:  Negative for back pain, joint swelling and myalgias.  Skin:  Negative for rash.  Neurological:  Negative for dizziness, syncope, light-headedness, numbness and headaches.  Hematological:  Negative for adenopathy.   Psychiatric/Behavioral:  Positive for agitation. Negative for confusion, dysphoric mood, sleep disturbance and suicidal ideas. The patient is not nervous/anxious.     Objective: BP 100/60   Ht 5' (1.524 m)   Wt 155 lb (70.3 kg)   BMI 30.27 kg/m    Physical Exam Constitutional:      Appearance: She is well-developed.  Genitourinary:     Vulva normal.     Right Labia: No rash, tenderness or lesions.    Left Labia: No tenderness, lesions or rash.    No vaginal discharge, erythema or tenderness.      Right Adnexa: not tender and no mass present.    Left Adnexa: not tender and no mass present.    No cervical friability or polyp.     Uterus is not enlarged or tender.  Breasts:    Right: No mass, nipple discharge, skin change or tenderness.     Left: No mass, nipple discharge, skin change or tenderness.  Neck:     Thyroid: No thyromegaly.  Cardiovascular:     Rate and Rhythm: Normal rate and regular rhythm.     Heart sounds: Normal heart sounds. No murmur heard. Pulmonary:     Effort: Pulmonary effort is normal.     Breath sounds: Normal breath sounds.  Abdominal:     Palpations: Abdomen is soft.     Tenderness: There is no abdominal tenderness. There is no guarding or rebound.  Musculoskeletal:        General: Normal range of motion.     Cervical back: Normal range of motion.  Lymphadenopathy:     Cervical: No cervical adenopathy.  Neurological:     General: No focal deficit present.     Mental Status: She is alert and oriented to person, place, and time.     Cranial Nerves: No cranial nerve deficit.  Skin:    General: Skin is warm and dry.  Psychiatric:        Mood and Affect: Mood normal.        Behavior: Behavior normal.        Thought Content: Thought content normal.        Judgment: Judgment normal.  Vitals reviewed.   Assessment/Plan: Encounter for annual routine gynecological examination  Cervical cancer screening - Plan: Cytology - PAP  Screening for  STD (sexually transmitted disease) - Plan: Cytology - PAP  Screening for HPV (human papillomavirus) - Plan: Cytology - PAP  LGSIL on Pap smear of cervix - Plan: Cytology - PAP; repeat pap today. Will f/u with resutls.   Encounter for initial prescription of contraceptive pills - Plan: norethindrone (MICRONOR) 0.35 MG tablet, POCT urine pregnancy; prog only options discussed due to hx of DVTs (pt has little info, suggested she try  to find where she was diagnosed with this to clarify). Pt would like to try POPs. Rx eRxd. Start today since neg UPT today, recheck UPT in 1 mo. Condoms for 1 wk. Pt aware of importance of taking at same time daily, use condoms if more than 3 hrs late. F/u prn.   Amenorrhea - Plan: POCT urine pregnancy; neg UPT; most likely due to depo use. F/u prn.   Family history of colon cancer in mother--doesn't qualify for cancer genetic testing at this time but will cont to follow guidelines; should start colonoscopies 10 yrs prior to her age of dx. No GI sx currently.   Meds ordered this encounter  Medications   norethindrone (MICRONOR) 0.35 MG tablet    Sig: Take 1 tablet (0.35 mg total) by mouth daily.    Dispense:  84 tablet    Refill:  3    Order Specific Question:   Supervising Provider    Answer:   Nadara Mustard [[id number]]              GYN counsel adequate intake of calcium and vitamin D, diet and exercise     F/U  Return in about 1 year (around 12/08/2021).  Ediel Unangst B. Daviona Herbert, PA-C 12/08/2020 4:18 PM

## 2020-12-15 LAB — CYTOLOGY - PAP
Chlamydia: NEGATIVE
Comment: NEGATIVE
Comment: NEGATIVE
Comment: NORMAL
Diagnosis: NEGATIVE
High risk HPV: NEGATIVE
Neisseria Gonorrhea: NEGATIVE

## 2021-02-11 ENCOUNTER — Telehealth: Payer: Self-pay

## 2021-02-11 NOTE — Telephone Encounter (Signed)
Normal to have irregular bleeding after stopping depo and since stopping POPs. Can't do estrogen options, so can only do POPs, depo, nexplanon, IUD.

## 2021-02-11 NOTE — Telephone Encounter (Signed)
Spoke w/patient. She took the Micronor for 1 month but stopped it d/t unable to eat d/t nausea. Patient reported taking OCP in the a.m. Advised qhs sometimes works better to help with nausea. Inquired about flow of bleeding. She reports it was light for the first 5 days, then moderate flow for the next 5 days, the past 6 days it has been heavy (getting heavier each day). Changing regular-super tampon q1-1.5 hours. Will send to [facility] for review/further instructions.

## 2021-02-11 NOTE — Telephone Encounter (Signed)
Patient reports she has started her period back after stopping Depo Injection. She has been on her period for 16 days. Inquiring if normal. Cb#[phone]

## 2021-02-12 NOTE — Telephone Encounter (Signed)
Pt aware. Will think about it and get back to [place].

## 2022-01-24 HISTORY — PX: WISDOM TOOTH EXTRACTION: SHX21

## 2022-11-19 ENCOUNTER — Emergency Department
Admission: EM | Admit: 2022-11-19 | Discharge: 2022-11-19 | Disposition: A | Discharge status: Home / Self Care | Payer: Medicaid Other | Attending: Emergency Medicine | Admitting: Emergency Medicine

## 2022-11-19 ENCOUNTER — Emergency Department: Payer: Medicaid Other

## 2022-11-19 DIAGNOSIS — F410 Panic disorder [episodic paroxysmal anxiety] without agoraphobia: Secondary | ICD-10-CM | POA: Insufficient documentation

## 2022-11-19 DIAGNOSIS — G44209 Tension-type headache, unspecified, not intractable: Secondary | ICD-10-CM | POA: Diagnosis not present

## 2022-11-19 LAB — CBC WITH DIFFERENTIAL/PLATELET
Abs Immature Granulocytes: 0.03 10*3/uL (ref 0.00–0.07)
Basophils Absolute: 0 10*3/uL (ref 0.0–0.1)
Basophils Relative: 0 %
Eosinophils Absolute: 0.1 10*3/uL (ref 0.0–0.5)
Eosinophils Relative: 1 %
HCT: 37 % (ref 36.0–46.0)
Hemoglobin: 12.4 g/dL (ref 12.0–15.0)
Immature Granulocytes: 0 %
Lymphocytes Relative: 31 %
Lymphs Abs: 3 10*3/uL (ref 0.7–4.0)
MCH: 27.1 pg (ref 26.0–34.0)
MCHC: 33.5 g/dL (ref 30.0–36.0)
MCV: 80.8 fL (ref 80.0–100.0)
Monocytes Absolute: 0.6 10*3/uL (ref 0.1–1.0)
Monocytes Relative: 6 %
Neutro Abs: 6.1 10*3/uL (ref 1.7–7.7)
Neutrophils Relative %: 62 %
Platelets: 319 10*3/uL (ref 150–400)
RBC: 4.58 MIL/uL (ref 3.87–5.11)
RDW: 14.4 % (ref 11.5–15.5)
WBC: 9.8 10*3/uL (ref 4.0–10.5)
nRBC: 0 % (ref 0.0–0.2)

## 2022-11-19 LAB — COMPREHENSIVE METABOLIC PANEL
ALT: 24 U/L (ref 0–44)
AST: 20 U/L (ref 15–41)
Albumin: 3.9 g/dL (ref 3.5–5.0)
Alkaline Phosphatase: 44 U/L (ref 38–126)
Anion gap: 5 (ref 5–15)
BUN: 11 mg/dL (ref 6–20)
CO2: 23 mmol/L (ref 22–32)
Calcium: 8.6 mg/dL — ABNORMAL LOW (ref 8.9–10.3)
Chloride: 108 mmol/L (ref 98–111)
Creatinine, Ser: 0.68 mg/dL (ref 0.44–1.00)
GFR, Estimated: >60 mL/min (ref >60)
Glucose, Bld: 109 mg/dL — ABNORMAL HIGH (ref 70–99)
Potassium: 3.6 mmol/L (ref 3.5–5.1)
Sodium: 136 mmol/L (ref 135–145)
Total Bilirubin: 0.5 mg/dL (ref 0.3–1.2)
Total Protein: 7.2 g/dL (ref 6.5–8.1)

## 2022-11-19 LAB — TROPONIN I (HIGH SENSITIVITY): Troponin I (High Sensitivity): 3 ng/L (ref <18)

## 2022-11-19 LAB — T4, FREE: Free T4: 0.95 ng/dL (ref 0.61–1.12)

## 2022-11-19 LAB — TSH: TSH: 1.495 u[IU]/mL (ref 0.350–4.500)

## 2022-11-19 MED ORDER — METOCLOPRAMIDE HCL 5 MG/ML IJ SOLN
10.0000 mg | Freq: Once | INTRAMUSCULAR | Status: AC
Start: 2022-11-19 — End: 2022-11-19
  Administered 2022-11-19: 10 mg via INTRAVENOUS
  Filled 2022-11-19: qty 2

## 2022-11-19 MED ORDER — LORAZEPAM 1 MG PO TABS: 1.0000 mg | ORAL_TABLET | Freq: Two times a day (BID) | ORAL | 0 refills | Status: AC | PRN

## 2022-11-19 MED ORDER — KETOROLAC TROMETHAMINE 30 MG/ML IJ SOLN
15.0000 mg | Freq: Once | INTRAMUSCULAR | Status: AC
  Administered 2022-11-19: 15 mg via INTRAVENOUS
  Filled 2022-11-19: qty 1

## 2022-11-19 MED ORDER — LORAZEPAM 1 MG PO TABS: 1.0000 mg | ORAL_TABLET | Freq: Once | ORAL | Status: DC

## 2022-11-19 MED ORDER — DIPHENHYDRAMINE HCL 50 MG/ML IJ SOLN
25.0000 mg | Freq: Once | INTRAMUSCULAR | Status: AC
  Administered 2022-11-19: 25 mg via INTRAVENOUS
  Filled 2022-11-19: qty 1

## 2022-11-19 MED ORDER — CYCLOBENZAPRINE HCL 10 MG PO TABS: 10.0000 mg | ORAL_TABLET | Freq: Three times a day (TID) | ORAL | 0 refills | Status: AC | PRN

## 2022-11-19 NOTE — Discharge Instructions (Signed)

## 2022-11-19 NOTE — ED Provider Notes (Signed)
[facility] Provider Note    Event Date/Time   First MD Initiated Contact with Patient 11/19/22 2143     (approximate)   History   Panic Attack (Pt presents to ED via POV from home for generalized anxiety and panic attacks. Pt states she has had multiple panic attacks over the past few days. Pt c/o upper neck pain around the base of the skull. Pt has hx of migraines and states medication at home is not alleviating pain. Pt denies SI/HI. Pt is ambulatory in triage. Pt is a and ox4. Pt resp are even and unlabored. Pt skin is dry and warm and appropriate for ethnicity. Mother is with pt at this time. ) and Headache   HPI  Ebony Edwards is a 27 y.o. female  here with increasing panic attacks. Pt reports that over the last week, she has been having frequent episodes of feeling nervous, shaky, like her heart is racing, followed by full blown "panic" that last up to 20-30 minutes. They are often resolved w/ speaking to her mother. She has had intermittent mild episodes like this in the past but nothing of this severity. No specific triggers or stressors. She also reports a progressively worsening tight sensation in her neck extending around her head with a headache, and tingling on the back of her head. No numbness, weakness. No vision changes.       Physical Exam   Triage Vital Signs: ED Triage Vitals  Encounter Vitals Group     BP 11/19/22 2112 (!) 136/94     Systolic BP Percentile --      Diastolic BP Percentile --      Pulse Rate 11/19/22 2112 (!) 102     Resp 11/19/22 2112 16     Temp 11/19/22 2112 98.7 F (37.1 C)     Temp src --      SpO2 11/19/22 2112 98 %     Weight 11/19/22 2119 187 lb (84.8 kg)     Height 11/19/22 2119 5' (1.524 m)     Head Circumference --      Peak Flow --      Pain Score 11/19/22 2118 9     Pain Loc --      Pain Education --      Exclude from Growth Chart --     Most recent vital signs: Vitals:   11/19/22 2300 11/19/22  2330  BP: (!) 107/58 105/62  Pulse: 73 (!) 58  Resp:    Temp:    SpO2: 100% 99%     General: Awake, no distress.  CV:  Good peripheral perfusion. RRR. Resp:  Normal work of breathing. Lungs clear to auscultation bilaterally. Abd:  No distention. No tenderness. Other:  Mild TTP over bilateral upper paracervical muscles, no midline tenderness. Scalp is normal. CNII-XII intact. Strength 5/5 bl UE and LE. Normal sensation to light touch. No dysmetria. Normal gait.   ED Results / Procedures / Treatments   Labs (all labs ordered are listed, but only abnormal results are displayed) Labs Reviewed  COMPREHENSIVE METABOLIC PANEL - Abnormal; Notable for the following components:      Result Value   Glucose, Bld 109 (*)    Calcium 8.6 (*)    All other components within normal limits  CBC WITH DIFFERENTIAL/PLATELET  TSH  T4, FREE  TROPONIN I (HIGH SENSITIVITY)     EKG Normal sinus rhythm, VR 94. PR 130, QRS 74, QTc 427. No acute ST  elevations or depressions. No ischemia or infarct.   RADIOLOGY CT Head: NAICA   I also independently reviewed and agree with radiologist interpretations.   PROCEDURES:  Critical Care performed: No  .1-3 Lead EKG Interpretation  Performed by: Shaune Pollack, MD Authorized by: Shaune Pollack, MD     Interpretation: non-specific     ECG rate:  80-110   ECG rate assessment: tachycardic     Rhythm: sinus tachycardia     Ectopy: none     Conduction: normal   Comments:     Indication: Weakness, heart racing     MEDICATIONS ORDERED IN ED: Medications  ketorolac (TORADOL) 30 MG/ML injection 15 mg (15 mg Intravenous Given 11/19/22 2236)  metoCLOPramide (REGLAN) injection 10 mg (10 mg Intravenous Given 11/19/22 2237)  diphenhydrAMINE (BENADRYL) injection 25 mg (25 mg Intravenous Given 11/19/22 2237)     IMPRESSION / MDM / ASSESSMENT AND PLAN / ED COURSE  I reviewed the triage vital signs and the nursing notes.                               Differential diagnosis includes, but is not limited to, panic attacks, tension headache, generalized anxiety, arrhythmia/SVT, anemia, asthma  Patient's presentation is most consistent with acute presentation with potential threat to life or bodily function.  The patient is on the cardiac monitor to evaluate for evidence of arrhythmia and/or significant heart rate changes  27  yo F here with increasingly frequent panic attacks, limiting ability to function/work. Labs very reassuring. TSH/Free T4 normal. CT Head shows NAICA. Trop negative. EKG nonischemic. Will give brief course of anxiolytics given the impairment with her sleep/functioning, and urge her to f/u as outpt.   FINAL CLINICAL IMPRESSION(S) / ED DIAGNOSES   Final diagnoses:  Tension headache  Panic disorder     Rx / DC Orders   ED Discharge Orders          Ordered    cyclobenzaprine (FLEXERIL) 10 MG tablet  3 times daily PRN        11/19/22 2317    LORazepam (ATIVAN) 1 MG tablet  2 times daily PRN        11/19/22 2317             Note:  This document was prepared using Dragon voice recognition software and may include unintentional dictation errors.   Shaune Pollack, MD 11/21/22 [phone]

## 2022-11-21 ENCOUNTER — Encounter: Payer: Self-pay | Admitting: Physician Assistant

## 2022-11-21 ENCOUNTER — Ambulatory Visit: Payer: Medicaid Other | Admitting: Physician Assistant

## 2022-11-21 VITALS — BP 120/90 | HR 99 | Temp 98.4°F | Resp 16 | Ht 60.0 in | Wt 184.8 lb

## 2022-11-21 DIAGNOSIS — F410 Panic disorder [episodic paroxysmal anxiety] without agoraphobia: Secondary | ICD-10-CM

## 2022-11-21 DIAGNOSIS — F411 Generalized anxiety disorder: Secondary | ICD-10-CM | POA: Diagnosis not present

## 2022-11-21 MED ORDER — SERTRALINE HCL 25 MG PO TABS: 25.0000 mg | ORAL_TABLET | Freq: Every day | ORAL | 3 refills | Status: AC

## 2022-11-21 NOTE — Progress Notes (Cosign Needed Addendum)
[facility] [address] [place], [place] [address]  Internal MEDICINE  Office Visit Note  Patient Name: Ebony Edwards  [id number]  [id number]  Date of Service: 11/29/2022   Complaints/HPI Pt is here for establishment of PCP. Chief Complaint  Patient presents with   New Patient (Initial Visit)   Panic Attack    Had 3-4 panic attacks on Saturday, cried so much that ended up having a severe headache and went to the ED   HPI Pt is here to establish care -Recently went to ED for panic attacks -states she had a panic attack 2 Saturdays ago, then again Monday, thursday while out to dinner and had to call her mom, then had another on Friday, and multiple Saturday the 26th along with a headache which is when she went to ED. She was given flexeril and ativan. CT head was normal. -States she has taken an ativan, but not the flexeril. Wasn't sure exactly when/how to take these. Discussed when these should be used as needed -States she has some history of anxiety, but was not having panic attacks this severe before and not nearly as often. Not aware of any triggers and no recent changes. Talking with her mom helps. -Has been on a med before but caused weight and is worried about this happening again. Discussed that this can be a side effect, but she is open to trying something as these panic attacks and anxiety are now impacting daily routine. -Pt began to have anxiety attack in office, but was able to do deep breathing exercises and continue with visit -No SI/HI  Current Medication: Outpatient Encounter Medications as of 11/21/2022  Medication Sig   cyclobenzaprine (FLEXERIL) 10 MG tablet Take 1 tablet (10 mg total) by mouth 3 (three) times daily as needed for muscle spasms (neck muscle tension).   LORazepam (ATIVAN) 1 MG tablet Take 1 tablet (1 mg total) by mouth 2 (two) times daily as needed for anxiety.   norethindrone (MICRONOR) 0.35 MG tablet Take 1 tablet (0.35 mg total)  by mouth daily.   sertraline (ZOLOFT) 25 MG tablet Take 1 tablet (25 mg total) by mouth daily.   [DISCONTINUED] albuterol (PROVENTIL HFA;VENTOLIN HFA) 108 (90 Base) MCG/ACT inhaler Inhale 1-2 puffs into the lungs every 6 (six) hours as needed.   No facility-administered encounter medications on file as of 11/21/2022.    Surgical History: Past Surgical History:  Procedure Laterality Date   TONSILLECTOMY     WISDOM TOOTH EXTRACTION Bilateral 2024    Medical History: Past Medical History:  Diagnosis Date   Anxiety    Asthma    Depression    DVT (deep venous thrombosis) (HCC)     Family History: Family History  Problem Relation Age of Onset   Colon cancer Mother 29   Diabetes Father    Hypertension Father    Hypertension Sister    Migraines Sister    Narcolepsy Sister    Breast cancer Neg Hx     Social History   Socioeconomic History   Marital status: Single    Spouse name: Not on file   Number of children: 0   Years of education: 12   Highest education level: High school graduate  Occupational History   Occupation: Radio producer: [employer]    Comment: [employer]  Tobacco Use   Smoking status: Never   Smokeless tobacco: Never  Vaping Use   Vaping status: Every Day  Substance and Sexual Activity  Alcohol use: Yes    Comment: drinks once per month. Will drink between 1-4 drinks at a time.   Drug use: No   Sexual activity: Yes    Partners: Male    Birth control/protection: Pill  Other Topics Concern   Not on file  Social History Narrative   Not on file   Social Determinants of Health   Financial Resource Strain: Not on file  Food Insecurity: No Food Insecurity (02/23/2017)   Hunger Vital Sign    Worried About Running Out of Food in the Last Year: Never true    Ran Out of Food in the Last Year: Never true  Transportation Needs: No Transportation Needs (02/23/2017)   PRAPARE - Administrator, Civil Service (Medical): No    Lack of  Transportation (Non-Medical): No  Physical Activity: Not on file  Stress: Not on file  Social Connections: Not on file  Intimate Partner Violence: Not At Risk (02/24/2022)   Received from [facility]   Humiliation, Afraid, Rape, and Kick questionnaire    Fear of Current or Ex-Partner: No    Emotionally Abused: No    Physically Abused: No    Sexually Abused: No     Review of Systems  Constitutional:  Negative for fatigue and fever.  HENT:  Negative for congestion, mouth sores and postnasal drip.   Respiratory:  Negative for cough.   Cardiovascular:  Positive for palpitations. Negative for chest pain.  Genitourinary:  Negative for flank pain.  Musculoskeletal:  Positive for myalgias.  Neurological:  Negative for headaches.  Psychiatric/Behavioral:  Negative for behavioral problems, self-injury and suicidal ideas. The patient is nervous/anxious.     Vital Signs: BP (!) 120/90   Pulse 99   Temp 98.4 F (36.9 C)   Resp 16   Ht 5' (1.524 m)   Wt 184 lb 12.8 oz (83.8 kg)   SpO2 97%   BMI 36.09 kg/m    Physical Exam Vitals and nursing note reviewed.  Constitutional:      Appearance: Normal appearance. She is well-developed.  HENT:     Head: Normocephalic and atraumatic.     Mouth/Throat:     Pharynx: No oropharyngeal exudate.  Eyes:     Pupils: Pupils are equal, round, and reactive to light.  Neck:     Thyroid: No thyromegaly.     Vascular: No JVD.     Trachea: No tracheal deviation.  Cardiovascular:     Rate and Rhythm: Normal rate and regular rhythm.     Heart sounds: Normal heart sounds. No murmur heard.    No friction rub. No gallop.  Pulmonary:     Effort: Pulmonary effort is normal. No respiratory distress.     Breath sounds: No wheezing or rales.  Chest:     Chest wall: No tenderness.  Abdominal:     General: Bowel sounds are normal.     Palpations: Abdomen is soft.  Musculoskeletal:        General: Normal range of motion.     Cervical back: Normal  range of motion and neck supple.  Lymphadenopathy:     Cervical: No cervical adenopathy.  Skin:    General: Skin is warm and dry.  Neurological:     Mental Status: She is alert and oriented to person, place, and time.     Cranial Nerves: No cranial nerve deficit.  Psychiatric:        Thought Content: Thought content normal.  Judgment: Judgment normal.     Comments: Pt started having anxiety attack in office and went through deep breathing and improved       Assessment/Plan: 1. GAD (generalized anxiety disorder) Will start on zoloft to help with anxiety and will titrate as needed. Advised to call if any problems - sertraline (ZOLOFT) 25 MG tablet; Take 1 tablet (25 mg total) by mouth daily.  Dispense: 30 tablet; Refill: 3  2. Panic disorder Will start on zoloft and may continue ativan as needed. Discussed switching to low dose xanax in future if needed    General Counseling: Aryahna verbalizes understanding of the findings of todays visit and agrees with plan of treatment. I have discussed any further diagnostic evaluation that may be needed or ordered today. We also reviewed her medications today. she has been encouraged to call the office with any questions or concerns that should arise related to todays visit.    Counseling:    No orders of the defined types were placed in this encounter.   Meds ordered this encounter  Medications   sertraline (ZOLOFT) 25 MG tablet    Sig: Take 1 tablet (25 mg total) by mouth daily.    Dispense:  30 tablet    Refill:  3     This patient was seen by Lynn Ito, PA-C in collaboration with Dr. Beverely Risen as a part of collaborative care agreement.   Time spent:35 Minutes

## 2022-11-29 NOTE — Addendum Note (Signed)
Addended by: Lynn Ito on: 11/29/2022 01:15 PM   Modules accepted: Level of Service

## 2022-12-19 ENCOUNTER — Ambulatory Visit: Payer: Medicaid Other | Admitting: Physician Assistant

## 2022-12-19 ENCOUNTER — Encounter: Payer: Self-pay | Admitting: Physician Assistant

## 2022-12-19 VITALS — BP 125/90 | HR 103 | Temp 98.3°F | Resp 16 | Ht 60.0 in | Wt 186.0 lb

## 2022-12-19 DIAGNOSIS — Z6836 Body mass index (BMI) 36.0-36.9, adult: Secondary | ICD-10-CM | POA: Diagnosis not present

## 2022-12-19 DIAGNOSIS — F411 Generalized anxiety disorder: Secondary | ICD-10-CM | POA: Diagnosis not present

## 2022-12-19 DIAGNOSIS — F410 Panic disorder [episodic paroxysmal anxiety] without agoraphobia: Secondary | ICD-10-CM

## 2022-12-19 NOTE — Progress Notes (Signed)
[facility] [address] [place], [place] [address]  Internal MEDICINE  Office Visit Note  Patient Name: Ebony Edwards  [id number]  [id number]  Date of Service: 12/19/2022  Chief Complaint  Patient presents with   Follow-up   Anxiety   Depression    HPI Pt is here for routine follow up -Felt a little dizzy first day on zoloft, ate something and then felt fine. So started eating prior to taking it and this resolved. Did well on it for a few weeks and helped anxiety, but then forgot to take it recently. States she has this problem with medications where even setting alarm doesn't help -Has not needed any ativan. No panic attacks since last visit. Overall anxiety decreased as well and wonders if something was going on that triggered her panic attacks and now is better again. -Unsure if she wants to restart zoloft since doing better now. If anxiety starts again then will restart daily and set reminder instead of alarm so she can't turn it off accidentally without actually taking med--reminder will continue to show up on phone screen until checked off.  -headaches come and go, but not as often -sleeping ok -pap done by GYN, has another coming up soon  -Weight has been fluctuating. Will lose and then will gain again. Walks daily at work, 2-3 days per week goes to gym at her apartment. Will do mainly cardio and occasionally weights. -Eats home cooked meals as well as eating out. Does try to pick grilled instead of fried when eating out. -Interested in metabolic test to help guide. Used to journal foods and thinks this helped.   Current Medication: Outpatient Encounter Medications as of 12/19/2022  Medication Sig   cyclobenzaprine (FLEXERIL) 10 MG tablet Take 1 tablet (10 mg total) by mouth 3 (three) times daily as needed for muscle spasms (neck muscle tension).   LORazepam (ATIVAN) 1 MG tablet Take 1 tablet (1 mg total) by mouth 2 (two) times daily as needed for anxiety.    norethindrone (MICRONOR) 0.35 MG tablet Take 1 tablet (0.35 mg total) by mouth daily.   sertraline (ZOLOFT) 25 MG tablet Take 1 tablet (25 mg total) by mouth daily.   No facility-administered encounter medications on file as of 12/19/2022.    Surgical History: Past Surgical History:  Procedure Laterality Date   TONSILLECTOMY     WISDOM TOOTH EXTRACTION Bilateral 2024    Medical History: Past Medical History:  Diagnosis Date   Anxiety    Asthma    Depression    DVT (deep venous thrombosis) (HCC)     Family History: Family History  Problem Relation Age of Onset   Colon cancer Mother 12   Diabetes Father    Hypertension Father    Hypertension Sister    Migraines Sister    Narcolepsy Sister    Breast cancer Neg Hx     Social History   Socioeconomic History   Marital status: Single    Spouse name: Not on file   Number of children: 0   Years of education: 12   Highest education level: High school graduate  Occupational History   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 Will drink between 1-4 drinks  at a time.   Drug use: No   Sexual activity: Yes    Partners: Male    Birth control/protection: Pill  Other Topics Concern   Not on file  Social History Narrative   Not on file   Social Determinants of Health   Financial Resource Strain: Not on file  Food Insecurity: No Food Insecurity (02/23/2017)   Hunger Vital Sign    Worried About Running Out of Food in the Last Year: Never true    Ran Out of Food in the Last Year: Never true  Transportation Needs: No Transportation Needs (02/23/2017)   PRAPARE - Administrator, Civil Service (Medical): No    Lack of Transportation (Non-Medical): No  Physical Activity: Not on file  Stress: Not  on file  Social Connections: Not on file  Intimate Partner Violence: Not At Risk (02/24/2022)   Received from [facility]   Humiliation, Afraid, Rape, and Kick questionnaire    Fear of Current or Ex-Partner: No    Emotionally Abused: No    Physically Abused: No    Sexually Abused: No      Review of Systems  Constitutional:  Negative for chills, fatigue and unexpected weight change.  HENT:  Positive for postnasal drip. Negative for congestion, rhinorrhea, sneezing and sore throat.   Eyes:  Negative for redness.  Respiratory:  Negative for cough, chest tightness and shortness of breath.   Cardiovascular:  Negative for chest pain and palpitations.  Gastrointestinal:  Negative for abdominal pain, constipation, diarrhea, nausea and vomiting.  Genitourinary:  Negative for dysuria and frequency.  Musculoskeletal:  Negative for arthralgias, back pain, joint swelling and neck pain.  Skin:  Negative for rash.  Neurological: Negative.  Negative for tremors and numbness.  Hematological:  Negative for adenopathy. Does not bruise/bleed easily.  Psychiatric/Behavioral:  Negative for behavioral problems (Depression), sleep disturbance and suicidal ideas.     Vital Signs: BP (!) 125/90   Pulse (!) 103   Temp 98.3 F (36.8 C)   Resp 16   Ht 5' (1.524 m)   Wt 186 lb (84.4 kg)   SpO2 96%   BMI 36.33 kg/m    Physical Exam Vitals and nursing note reviewed.  Constitutional:      General: She is not in acute distress.    Appearance: Normal appearance. She is well-developed. She is obese. She is not diaphoretic.  HENT:     Head: Normocephalic and atraumatic.     Mouth/Throat:     Pharynx: No oropharyngeal exudate.  Eyes:     Pupils: Pupils are equal, round, and reactive to light.  Neck:     Thyroid: No thyromegaly.     Vascular: No JVD.     Trachea: No tracheal deviation.  Cardiovascular:     Rate and Rhythm: Normal rate and regular rhythm.     Heart sounds: Normal heart sounds. No  murmur heard.    No friction rub. No gallop.  Pulmonary:     Effort: Pulmonary effort is normal. No respiratory distress.     Breath sounds: No wheezing or rales.  Chest:     Chest wall: No tenderness.  Abdominal:     General: Bowel sounds are normal.     Palpations: Abdomen is soft.  Musculoskeletal:        General: Normal range of motion.     Cervical back: Normal range of motion and neck supple.  Lymphadenopathy:     Cervical: No cervical adenopathy.  Skin:    General: Skin is warm and dry.  Neurological:     Mental Status: She is alert and oriented to person, place, and time.     Cranial Nerves: No cranial nerve deficit.  Psychiatric:        Behavior: Behavior normal.        Thought Content: Thought content normal.        Judgment: Judgment normal.        Assessment/Plan: 1. BMI 36.0-36.9,adult - Metabolic Test -Will work on diet/exercise and may try using myfitnesspal vs journaling Obesity Counseling: Risk Assessment: An assessment of behavioral risk factors was made today and includes lack of exercise sedentary lifestyle, lack of portion control and poor dietary habits.  Risk Modification Advice: She was counseled on portion control guidelines. Restricting daily caloric intake to 1600-1800. The detrimental long term effects of obesity on her health and ongoing poor compliance was also discussed with the patient.  2. GAD (generalized anxiety disorder) May restart zoloft daily if finding anxiety increasing again and will keep reminder to take  3. Panic disorder May take ativan as needed as before, but has not needed any since last visit   General Counseling: Twinkle verbalizes understanding of the findings of todays visit and agrees with plan of treatment. I have discussed any further diagnostic evaluation that may be needed or ordered today. We also reviewed her medications today. she has been encouraged to call the office with any questions or concerns that should  arise related to todays visit.    Orders Placed This Encounter  Procedures   Metabolic Test    No orders of the defined types were placed in this encounter.   This patient was seen by Lynn Ito, PA-C in collaboration with Dr. Beverely Risen as a part of collaborative care agreement.   Total time spent:40 Minutes Time spent includes review of chart, medications, test results, and follow up plan with the patient.      Dr Lyndon Code Internal medicine

## 2023-01-27 ENCOUNTER — Ambulatory Visit: Payer: Medicaid Other | Admitting: Physician Assistant

## 2023-01-30 ENCOUNTER — Ambulatory Visit: Payer: Medicaid Other | Admitting: Physician Assistant

## 2023-03-22 ENCOUNTER — Emergency Department
Admission: EM | Admit: 2023-03-22 | Discharge: 2023-03-22 | Disposition: A | Discharge status: Home / Self Care | Payer: Medicaid Other | Attending: Emergency Medicine | Admitting: Emergency Medicine

## 2023-03-22 ENCOUNTER — Other Ambulatory Visit: Payer: Self-pay

## 2023-03-22 DIAGNOSIS — F419 Anxiety disorder, unspecified: Secondary | ICD-10-CM | POA: Diagnosis not present

## 2023-03-22 DIAGNOSIS — R000 Tachycardia, unspecified: Secondary | ICD-10-CM | POA: Diagnosis not present

## 2023-03-22 DIAGNOSIS — R42 Dizziness and giddiness: Secondary | ICD-10-CM | POA: Insufficient documentation

## 2023-03-22 LAB — BASIC METABOLIC PANEL
Anion gap: 12 (ref 5–15)
BUN: 14 mg/dL (ref 6–20)
CO2: 20 mmol/L — ABNORMAL LOW (ref 22–32)
Calcium: 9.1 mg/dL (ref 8.9–10.3)
Chloride: 104 mmol/L (ref 98–111)
Creatinine, Ser: 0.66 mg/dL (ref 0.44–1.00)
GFR, Estimated: >60 mL/min (ref >60)
Glucose, Bld: 104 mg/dL — ABNORMAL HIGH (ref 70–99)
Potassium: 3.5 mmol/L (ref 3.5–5.1)
Sodium: 136 mmol/L (ref 135–145)

## 2023-03-22 LAB — URINALYSIS, ROUTINE W REFLEX MICROSCOPIC
Bilirubin Urine: NEGATIVE
Glucose, UA: NEGATIVE mg/dL
Ketones, ur: 5 mg/dL — AB
Leukocytes,Ua: NEGATIVE
Nitrite: NEGATIVE
Protein, ur: NEGATIVE mg/dL
Specific Gravity, Urine: 1.006 (ref 1.005–1.030)
pH: 5 (ref 5.0–8.0)

## 2023-03-22 LAB — CBC
HCT: 40.1 % (ref 36.0–46.0)
Hemoglobin: 13.1 g/dL (ref 12.0–15.0)
MCH: 26.5 pg (ref 26.0–34.0)
MCHC: 32.7 g/dL (ref 30.0–36.0)
MCV: 81.2 fL (ref 80.0–100.0)
Platelets: 362 10*3/uL (ref 150–400)
RBC: 4.94 MIL/uL (ref 3.87–5.11)
RDW: 14.5 % (ref 11.5–15.5)
WBC: 9.4 10*3/uL (ref 4.0–10.5)
nRBC: 0 % (ref 0.0–0.2)

## 2023-03-22 LAB — PREGNANCY, URINE: Preg Test, Ur: NEGATIVE

## 2023-03-22 LAB — CBG MONITORING, ED: Glucose-Capillary: 94 mg/dL (ref 70–99)

## 2023-03-22 LAB — TROPONIN I (HIGH SENSITIVITY): Troponin I (High Sensitivity): 2 ng/L (ref <18)

## 2023-03-22 NOTE — ED Notes (Signed)
 Pt verbalizes understanding of discharge instructions. Opportunity for questioning and answers were provided. Pt discharged from ED to home with family.

## 2023-03-22 NOTE — ED Triage Notes (Signed)
 Pt to ED via POV c/o dizziness. Pt was visiting friend on mother/baby when she started to feel dizzy and feeling like she was going to pass out. When she got into friends room suddenly felt hot and shaky. Feeling went away, just feels a little lightheaded at this time. Denies CP but endorses slight shortness of breath.

## 2023-03-22 NOTE — ED Provider Notes (Signed)
 [facility] Provider Note    Event Date/Time   First MD Initiated Contact with Patient 03/22/23 2134     (approximate)   History   Dizziness   HPI  Ebony Edwards is a 28 y.o. female who presents to the ED for evaluation of Dizziness   Review of PCP visit from November.  Obese patient with history of anxiety.  Patient presents to the ED alongside her parents for evaluation of acute on chronic dizziness.  She reports for multiple years now, since 2021 or 22, she has intermittent presyncopal dizziness.  She reports a lot of stress and anxiety in her life.  She was visiting her best friend upstairs on mother-baby, just delivered a child.  Reports she was feeling fine prior to this, but she got up to walk out to her car to grab something and then walked back up to the room when she felt dizziness and presyncope typical of her chronic intermittent episodes.  She was able to sit back down and feeling somewhat better.  She called her parents who came to check on her and she checks into the ED for evaluation.  No pain such as chest pain, headache or back pain.  No syncope or falls or injuries.  Physical Exam   Triage Vital Signs: ED Triage Vitals  Encounter Vitals Group     BP 03/22/23 1938 (!) 157/102     Systolic BP Percentile --      Diastolic BP Percentile --      Pulse Rate 03/22/23 1938 96     Resp 03/22/23 1938 18     Temp 03/22/23 1938 98.2 F (36.8 C)     Temp Source 03/22/23 1938 Oral     SpO2 03/22/23 1938 99 %     Weight --      Height --      Head Circumference --      Peak Flow --      Pain Score 03/22/23 1935 0     Pain Loc --      Pain Education --      Exclude from Growth Chart --     Most recent vital signs: Vitals:   03/22/23 1938 03/22/23 2135  BP: (!) 157/102 123/73  Pulse: 96 71  Resp: 18 20  Temp: 98.2 F (36.8 C) 98.4 F (36.9 C)  SpO2: 99% 99%    General: Awake, no distress.  CV:  Good peripheral perfusion.   Resp:  Normal effort.  Abd:  No distention.  MSK:  No deformity noted.  Neuro:  No focal deficits appreciated. Cranial nerves II through XII intact 5/5 strength and sensation in all 4 extremities Other:     ED Results / Procedures / Treatments   Labs (all labs ordered are listed, but only abnormal results are displayed) Labs Reviewed  BASIC METABOLIC PANEL - Abnormal; Notable for the following components:      Result Value   CO2 20 (*)    Glucose, Bld 104 (*)    All other components within normal limits  URINALYSIS, ROUTINE W REFLEX MICROSCOPIC - Abnormal; Notable for the following components:   Color, Urine YELLOW (*)    APPearance HAZY (*)    Hgb urine dipstick LARGE (*)    Ketones, ur 5 (*)    Bacteria, UA RARE (*)    All other components within normal limits  CBC  PREGNANCY, URINE  CBG MONITORING, ED  POC URINE PREG, ED  TROPONIN I (HIGH SENSITIVITY)  TROPONIN I (HIGH SENSITIVITY)    EKG Sinus tachycardia with rate of 103 bpm.  Normal axis and intervals.  No clear signs of acute ischemia.  RADIOLOGY   Official radiology report(s): No results found.  PROCEDURES and INTERVENTIONS:  .1-3 Lead EKG Interpretation  Performed by: Delton Prairie, MD Authorized by: Delton Prairie, MD     Interpretation: normal     ECG rate:  70   ECG rate assessment: normal     Rhythm: sinus rhythm     Ectopy: none     Conduction: normal     Medications - No data to display   IMPRESSION / MDM / ASSESSMENT AND PLAN / ED COURSE  I reviewed the triage vital signs and the nursing notes.  Differential diagnosis includes, but is not limited to, dehydration, orthostasis, cardiac dysrhythmia, panic attack, hypoglycemia  {Patient presents with symptoms of an acute illness or injury that is potentially life-threatening.  Patient presents to the ED due to acute on chronic dizziness possibly multifactorial in the setting of anxiety or panic attack and relative dehydration.  She has dry  lips and looks somewhat dry to me but overall well without signs of trauma or neurologic deficits.  She reports feeling okay.  Ketones in the urine also suggest a degree of dehydration.  Normal CBC and metabolic panel.  Normal CBG.  Provide oral rehydration and reassurance, she is suitable for outpatient management.  No dysrhythmias.  I considered observation admission for this patient      FINAL CLINICAL IMPRESSION(S) / ED DIAGNOSES   Final diagnoses:  Dizziness     Rx / DC Orders   ED Discharge Orders     None        Note:  This document was prepared using Dragon voice recognition software and may include unintentional dictation errors.   Delton Prairie, MD 03/22/23 [phone]

## 2023-08-11 ENCOUNTER — Encounter: Payer: Self-pay | Admitting: Advanced Practice Midwife
